# Patient Record
Sex: Female | Born: 1955 | Race: White | Hispanic: No | Smoking: Never smoker
Health system: Southern US, Community
[De-identification: ages and names within clinical notes are randomized; demographics above are authoritative.]

## PROBLEM LIST (undated history)

## (undated) DIAGNOSIS — K219 Gastro-esophageal reflux disease without esophagitis: Secondary | ICD-10-CM

## (undated) DIAGNOSIS — K5909 Other constipation: Secondary | ICD-10-CM

## (undated) DIAGNOSIS — K589 Irritable bowel syndrome without diarrhea: Secondary | ICD-10-CM

## (undated) DIAGNOSIS — R519 Headache, unspecified: Secondary | ICD-10-CM

## (undated) DIAGNOSIS — I10 Essential (primary) hypertension: Secondary | ICD-10-CM

## (undated) DIAGNOSIS — Z8719 Personal history of other diseases of the digestive system: Secondary | ICD-10-CM

## (undated) DIAGNOSIS — Z8489 Family history of other specified conditions: Secondary | ICD-10-CM

## (undated) DIAGNOSIS — R51 Headache: Secondary | ICD-10-CM

## (undated) DIAGNOSIS — R21 Rash and other nonspecific skin eruption: Secondary | ICD-10-CM

## (undated) DIAGNOSIS — D013 Carcinoma in situ of anus and anal canal: Secondary | ICD-10-CM

## (undated) DIAGNOSIS — F329 Major depressive disorder, single episode, unspecified: Secondary | ICD-10-CM

## (undated) DIAGNOSIS — F419 Anxiety disorder, unspecified: Secondary | ICD-10-CM

## (undated) DIAGNOSIS — R350 Frequency of micturition: Secondary | ICD-10-CM

## (undated) DIAGNOSIS — F32A Depression, unspecified: Secondary | ICD-10-CM

## (undated) HISTORY — PX: BREAST BIOPSY: SHX20

## (undated) HISTORY — DX: Essential (primary) hypertension: I10

## (undated) HISTORY — PX: OTHER SURGICAL HISTORY: SHX169

## (undated) HISTORY — DX: Irritable bowel syndrome, unspecified: K58.9

## (undated) HISTORY — PX: TUBAL LIGATION: SHX77

---
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
## 1999-01-10 ENCOUNTER — Other Ambulatory Visit: Admission: RE | Admit: 1999-01-10 | Discharge: 1999-01-10 | Payer: Self-pay | Admitting: *Deleted

## 1999-03-24 ENCOUNTER — Encounter: Payer: Self-pay | Admitting: Emergency Medicine

## 1999-03-24 ENCOUNTER — Emergency Department (HOSPITAL_COMMUNITY): Admission: EM | Admit: 1999-03-24 | Discharge: 1999-03-24 | Payer: Self-pay | Admitting: Emergency Medicine

## 1999-03-28 ENCOUNTER — Encounter: Admission: RE | Admit: 1999-03-28 | Discharge: 1999-03-28 | Payer: Self-pay | Admitting: Obstetrics and Gynecology

## 1999-03-28 ENCOUNTER — Encounter: Payer: Self-pay | Admitting: Obstetrics and Gynecology

## 2000-01-30 ENCOUNTER — Other Ambulatory Visit: Admission: RE | Admit: 2000-01-30 | Discharge: 2000-01-30 | Payer: Self-pay | Admitting: Obstetrics and Gynecology

## 2000-03-07 ENCOUNTER — Ambulatory Visit (HOSPITAL_BASED_OUTPATIENT_CLINIC_OR_DEPARTMENT_OTHER): Admission: RE | Admit: 2000-03-07 | Discharge: 2000-03-07 | Payer: Self-pay | Admitting: Podiatry

## 2000-03-07 HISTORY — PX: OTHER SURGICAL HISTORY: SHX169

## 2000-03-28 ENCOUNTER — Encounter: Admission: RE | Admit: 2000-03-28 | Discharge: 2000-03-28 | Payer: Self-pay | Admitting: Obstetrics and Gynecology

## 2000-03-28 ENCOUNTER — Encounter: Payer: Self-pay | Admitting: Obstetrics and Gynecology

## 2000-12-04 ENCOUNTER — Encounter: Payer: Self-pay | Admitting: Urology

## 2000-12-04 ENCOUNTER — Encounter: Admission: RE | Admit: 2000-12-04 | Discharge: 2000-12-04 | Payer: Self-pay | Admitting: Urology

## 2001-01-15 ENCOUNTER — Other Ambulatory Visit: Admission: RE | Admit: 2001-01-15 | Discharge: 2001-01-15 | Payer: Self-pay | Admitting: *Deleted

## 2001-03-28 ENCOUNTER — Encounter: Payer: Self-pay | Admitting: *Deleted

## 2001-03-28 ENCOUNTER — Encounter: Admission: RE | Admit: 2001-03-28 | Discharge: 2001-03-28 | Payer: Self-pay | Admitting: *Deleted

## 2001-04-04 ENCOUNTER — Encounter: Payer: Self-pay | Admitting: *Deleted

## 2001-04-04 ENCOUNTER — Encounter: Admission: RE | Admit: 2001-04-04 | Discharge: 2001-04-04 | Payer: Self-pay | Admitting: *Deleted

## 2002-01-20 ENCOUNTER — Other Ambulatory Visit: Admission: RE | Admit: 2002-01-20 | Discharge: 2002-01-20 | Payer: Self-pay | Admitting: *Deleted

## 2002-03-30 ENCOUNTER — Encounter: Admission: RE | Admit: 2002-03-30 | Discharge: 2002-03-30 | Payer: Self-pay | Admitting: *Deleted

## 2002-03-30 ENCOUNTER — Encounter: Payer: Self-pay | Admitting: *Deleted

## 2002-10-10 ENCOUNTER — Emergency Department (HOSPITAL_COMMUNITY): Admission: EM | Admit: 2002-10-10 | Discharge: 2002-10-11 | Payer: Self-pay

## 2003-01-26 ENCOUNTER — Other Ambulatory Visit: Admission: RE | Admit: 2003-01-26 | Discharge: 2003-01-26 | Payer: Self-pay | Admitting: *Deleted

## 2003-04-01 ENCOUNTER — Encounter: Admission: RE | Admit: 2003-04-01 | Discharge: 2003-04-01 | Payer: Self-pay | Admitting: *Deleted

## 2004-04-05 ENCOUNTER — Encounter: Admission: RE | Admit: 2004-04-05 | Discharge: 2004-04-05 | Payer: Self-pay | Admitting: *Deleted

## 2004-12-07 ENCOUNTER — Emergency Department (HOSPITAL_COMMUNITY): Admission: EM | Admit: 2004-12-07 | Discharge: 2004-12-07 | Payer: Self-pay | Admitting: Family Medicine

## 2005-04-09 ENCOUNTER — Encounter: Admission: RE | Admit: 2005-04-09 | Discharge: 2005-04-09 | Payer: Self-pay | Admitting: *Deleted

## 2005-04-26 ENCOUNTER — Encounter: Admission: RE | Admit: 2005-04-26 | Discharge: 2005-04-26 | Payer: Self-pay | Admitting: *Deleted

## 2006-01-30 ENCOUNTER — Ambulatory Visit (HOSPITAL_BASED_OUTPATIENT_CLINIC_OR_DEPARTMENT_OTHER): Admission: RE | Admit: 2006-01-30 | Discharge: 2006-01-30 | Payer: Self-pay | Admitting: Urology

## 2006-01-30 ENCOUNTER — Encounter (INDEPENDENT_AMBULATORY_CARE_PROVIDER_SITE_OTHER): Payer: Self-pay | Admitting: Specialist

## 2006-01-30 HISTORY — PX: OTHER SURGICAL HISTORY: SHX169

## 2006-04-29 ENCOUNTER — Encounter: Admission: RE | Admit: 2006-04-29 | Discharge: 2006-04-29 | Payer: Self-pay | Admitting: *Deleted

## 2007-05-05 ENCOUNTER — Encounter: Admission: RE | Admit: 2007-05-05 | Discharge: 2007-05-05 | Payer: Self-pay | Admitting: Obstetrics & Gynecology

## 2007-05-12 ENCOUNTER — Encounter: Admission: RE | Admit: 2007-05-12 | Discharge: 2007-05-12 | Payer: Self-pay | Admitting: Obstetrics & Gynecology

## 2008-04-25 ENCOUNTER — Emergency Department (HOSPITAL_COMMUNITY): Admission: EM | Admit: 2008-04-25 | Discharge: 2008-04-25 | Payer: Self-pay | Admitting: Emergency Medicine

## 2008-05-05 ENCOUNTER — Encounter: Admission: RE | Admit: 2008-05-05 | Discharge: 2008-05-05 | Payer: Self-pay | Admitting: Obstetrics & Gynecology

## 2009-04-02 HISTORY — PX: CATARACT EXTRACTION W/ INTRAOCULAR LENS  IMPLANT, BILATERAL: SHX1307

## 2009-05-10 ENCOUNTER — Encounter: Admission: RE | Admit: 2009-05-10 | Discharge: 2009-05-10 | Payer: Self-pay | Admitting: Obstetrics and Gynecology

## 2009-12-15 ENCOUNTER — Ambulatory Visit (HOSPITAL_COMMUNITY): Admission: RE | Admit: 2009-12-15 | Discharge: 2009-12-15 | Payer: Self-pay | Admitting: Gastroenterology

## 2009-12-15 HISTORY — PX: COLONOSCOPY: SHX174

## 2010-04-22 ENCOUNTER — Encounter: Payer: Self-pay | Admitting: Orthopedic Surgery

## 2010-04-22 ENCOUNTER — Other Ambulatory Visit: Payer: Self-pay | Admitting: Obstetrics and Gynecology

## 2010-04-22 DIAGNOSIS — Z1239 Encounter for other screening for malignant neoplasm of breast: Secondary | ICD-10-CM

## 2010-04-22 DIAGNOSIS — Z1231 Encounter for screening mammogram for malignant neoplasm of breast: Secondary | ICD-10-CM

## 2010-04-23 ENCOUNTER — Encounter: Payer: Self-pay | Admitting: Obstetrics & Gynecology

## 2010-05-11 ENCOUNTER — Ambulatory Visit
Admission: RE | Admit: 2010-05-11 | Discharge: 2010-05-11 | Disposition: A | Discharge status: Home / Self Care | Payer: 59 | Source: Ambulatory Visit | Attending: Obstetrics and Gynecology | Admitting: Obstetrics and Gynecology

## 2010-05-11 DIAGNOSIS — Z1231 Encounter for screening mammogram for malignant neoplasm of breast: Secondary | ICD-10-CM

## 2010-06-20 ENCOUNTER — Other Ambulatory Visit: Payer: Self-pay | Admitting: Obstetrics and Gynecology

## 2010-06-23 ENCOUNTER — Other Ambulatory Visit: Payer: Self-pay | Admitting: Obstetrics and Gynecology

## 2010-08-18 NOTE — Op Note (Signed)
NAMEAUBRE, [name]              ACCOUNT NO.:  [id number]   MEDICAL RECORD NO.:  [id number]          PATIENT TYPE:  AMB   LOCATION:  [facility]                         FACILITY:  [facility]   PHYSICIAN:  Ronald L. Earlene Plater, M.D.  DATE OF BIRTH:  Sep 05, 1955   DATE OF PROCEDURE:  01/30/2006  DATE OF DISCHARGE:                                 OPERATIVE REPORT   PREOPERATIVE DIAGNOSIS:  Possible interstitial cystitis.   OPERATIVE PROCEDURE:  1. Cystoscopy.  2. Hydraulic bladder distention.  3. Bladder biopsy.   SURGEON:  Lucrezia Starch. Earlene Plater, M.D.   ANESTHESIA:  LMA.   ESTIMATED BLOOD LOSS:  Negligible.   TUBES:  None.   COMPLICATIONS:  None.   INDICATIONS FOR PROCEDURE:  Ms. April Rodriguez is a lovely 55 year old white female  who basically presented with progressive bladder symptoms.  She had a  presumptive diagnosis of interstitial cystitis in the past, and has had DMSO  treatments.  More recently, it has been more progressive with urgency and  suprapubic pressure and actually having some urge incontinence.  She has  bladder pressure and pain, relieved by voiding.  Sometimes slight pain after  voiding.  She also has migraine headaches.  She notes some dysuria,  frequency and hesitance.   DESCRIPTION OF PROCEDURE:  The patient was placed in the supine position,  and after the proper LMA anesthesia, was placed in the dorsal lithotomy  position and prepped and draped with Betadine in a sterile fashion.  A  cystourethroscopy was performed with a 22.5-French Olympus panendoscope  utilizing the 12-degree and the 70-degree lenses.  The bladder was carefully  inspected and noted to be without lesion.  Efflux of clear urine was noted  from the normally-placed ureteral orifices bilaterally, and the bladder was  smooth-walled.  A hydraulic bladder distention was then performed, and the  bladder was distended to 80 cm of water pressure with normal saline.  The  capacity was noted to be 800 mL.  When it was  relieved, there were a few  lateral glomerulations, although they were very sparse.  There were  certainly no cracks and no bleeding.  A biopsy was obtained from the  posterior midline and  submitted to pathology.  The patient base was cauterized with Bovie  coagulation current.  The bladder was drained.  The panendoscope was  removed.   The patient was taken to the recovery room, stable.      Ronald L. Earlene Plater, M.D.  Electronically Signed     RLD/MEDQ  D:  01/30/2006  T:  01/30/2006  Job:  [id number]

## 2010-08-18 NOTE — Op Note (Signed)
[place]  Patient:    April Rodriguez, April Rodriguez                     MRN: [id number] Proc. Date: 03/07/00 Adm. Date:  [id number] Attending:  Cordella Register                           Operative Report  BRIEF HISTORY:  The patient presents with large bunion deformity of the right foot of long-term nature.  She has had this extensively and has tried conservative care without relief of symptoms.  SURGEON:  Cordella Register, D.P.M.  PREOPERATIVE DIAGNOSIS:  Hallux abductovalgus deformity - right foot.  POSTOPERATIVE DIAGNOSIS:  Hallux abductovalgus deformity - right foot.  ANESTHESIA:  IV sedation with local infiltration by the department of Anesthesia.  HEMOSTASIS:  Ankle tourniquet, right.  PROCEDURE PERFORMED:  Austin bunionectomy with 0.045 internal K-wire fixation - right.  INDICATIONS FOR SURGERY:  Chronic discomfort of the joint, inability to wear shoe gear, failure to respond to wider shoes, soak therapy and orthotic therapy.  DESCRIPTION OF PROCEDURE:  The patient was brought to the OR and placed in supine position on the OR table.  The patient injected with total of 9 cc Xylocaine/ Marcaine mixture.  The patients right foot was prepped and draped utilizing standard aseptic technique.  The right foot was exsanguinated utilizing Esmarch.  The right ankle tourniquet was inflated to 250 mmHg and the following procedure was performed - Eliberto Ivory bunionectomy right.  Attention was directed to the dorsal aspect right foot overlying the first metatarsal phalangeal joint where an approximate 6 cm curvilinear incision was made.  The incision was deepened through subcutaneous tissue to the level of the capsular tissue where an inverted L-shaped capsular incision was performed.  The capsular tissue was sharply dissected off the underlying bone revealing a large hyperostosis on the medial aspect of the first metatarsal head.  The first intermetatarsal  space was then entered and the conjoined tendon to the adductor tendon was released.  Attention was then directed back to the medial aspect of the first metatarsal head.  The redundant bone was resected off the shaft of the first metatarsal.  A V-shaped osteotomy was then performed to the first metatarsal head at the apex mid metaphysis and based at the level of the anatomical neck of the first metatarsal.  The capitular bed was transposed in a lateral direction so as to reduce the increase ______ metatarsal angle and was fixated utilizing 0.045 internal K-wire fixation. The redundant medial shelf was resected flush with the shaft of the first metatarsal.  All roughened bone edges were rasped smooth.  The wound was flushed with copious amounts of sterile Garamycin solution.  The capsular tissue was reapproximated utilizing with 3-0 Dexon in a continuous running fashion and subcutaneous tissues were reapproximated and maintained utilizing 4-0 Dexon in continuous running fashion.  Skin margins were reapproximated and maintained utilizing 5-0 Dexon in a subcuticular fashion.  Surgical site was infiltrated 1 cc of Dexamethasone and a dry, sterile compressive dressing was applied to the right foot.  The right ankle tourniquet was deflated. Capillary refill was noted immediately to all digits of the right foot.  The patient tolerated the surgery and anesthesia well, was transferred from the OR in satisfactory condition to the department of anesthesia and was discharged. DD:  03/07/00 TD:  03/07/00 Job: [id number]

## 2011-04-10 ENCOUNTER — Other Ambulatory Visit: Payer: Self-pay | Admitting: Obstetrics and Gynecology

## 2011-04-10 DIAGNOSIS — Z1231 Encounter for screening mammogram for malignant neoplasm of breast: Secondary | ICD-10-CM

## 2011-05-16 ENCOUNTER — Ambulatory Visit
Admission: RE | Admit: 2011-05-16 | Discharge: 2011-05-16 | Disposition: A | Discharge status: Home / Self Care | Payer: 59 | Source: Ambulatory Visit | Attending: Obstetrics and Gynecology | Admitting: Obstetrics and Gynecology

## 2011-05-16 DIAGNOSIS — Z1231 Encounter for screening mammogram for malignant neoplasm of breast: Secondary | ICD-10-CM

## 2011-05-22 ENCOUNTER — Encounter: Payer: Self-pay | Admitting: Pulmonary Disease

## 2011-05-23 ENCOUNTER — Institutional Professional Consult (permissible substitution): Payer: 59 | Admitting: Pulmonary Disease

## 2011-06-28 ENCOUNTER — Ambulatory Visit (INDEPENDENT_AMBULATORY_CARE_PROVIDER_SITE_OTHER): Payer: 59 | Admitting: Pulmonary Disease

## 2011-06-28 ENCOUNTER — Encounter: Payer: Self-pay | Admitting: Pulmonary Disease

## 2011-06-28 VITALS — BP 124/70 | HR 83 | Temp 97.8°F | Ht 63.0 in | Wt 130.4 lb

## 2011-06-28 DIAGNOSIS — G4700 Insomnia, unspecified: Secondary | ICD-10-CM | POA: Insufficient documentation

## 2011-06-28 NOTE — Assessment & Plan Note (Signed)
The patient has been having ongoing issues with initiation and maintenance of sleep.  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  I did not appreciate any history that would suggest obstructive sleep apnea, or REM behavior disorder.  She does have some mild parasomnias with sleep talking and rarely sleep walking.  Her history does suggest the possibility of the restless leg syndrome.  I have asked her to go home and think about her symptoms and let me know if she is having leg issues.  In the meantime, I've had a long discussion with her about sleep hygiene and also behavioral therapies for insomnia.  I have outlined ritualistic behaviors, as well as stimulus control therapy.

## 2011-06-28 NOTE — Progress Notes (Signed)
  Subjective:    Patient ID: [name], female    DOB: [birth year], 56 y.o.   MRN: [id number]  HPI The patient is a 56 year old female who is referred for evaluation of insomnia.  The patient has had issues with sleep onset and maintenance for many years, and it continues to be an issue for her.  She typically goes to bed between 9:30 and 10:00 at night, and usually takes 30 minutes or more to get to sleep.  Sometimes it may take hours.  The patient states that her "mind races".  When she gets to sleep, she may have 2 or 3 awakenings during the night, and then has trouble getting back to sleep.  The patient typically starts her day at 6 AM, and does not feel rested upon arising.  Patient does not watch TV in bed or read.  Her husband has not heard snoring or an abnormal breathing pattern, but she does occasionally have sleep talking.  The patient states that she has vivid dreams, but she does not have abnormal behaviors during the night which suggest that she is acting out her dreams.  Her husband states that she does kick/twitch a lot during the night, and when questioned carefully she is unsure if she may have restless leg syndrome symptoms.  The patient drinks 2-3 caffeinated beverages a day, with the last being before 11 AM.  She does not nap during the day.  She denies significant sleepiness in the evenings while watching television or reading.   Review of Systems  Constitutional: Negative for fever and unexpected weight change.  HENT: Positive for ear pain, congestion, sore throat and sneezing. Negative for nosebleeds, rhinorrhea, trouble swallowing, dental problem, postnasal drip and sinus pressure.   Eyes: Negative for redness and itching.  Respiratory: Positive for cough. Negative for chest tightness, shortness of breath and wheezing.   Cardiovascular: Negative for palpitations and leg swelling.  Gastrointestinal: Negative for nausea and vomiting.  Genitourinary: Negative for dysuria.    Musculoskeletal: Negative for joint swelling.  Skin: Negative for rash.  Neurological: Positive for headaches.  Hematological: Does not bruise/bleed easily.  Psychiatric/Behavioral: Negative for dysphoric mood. The patient is not nervous/anxious.        Objective:   Physical Exam Constitutional:  Well developed, no acute distress  HENT:  Nares patent without discharge  Oropharynx without exudate, palate and uvula are normal  Eyes:  Perrla, eomi, no scleral icterus  Neck:  No JVD, no TMG  Cardiovascular:  Normal rate, regular rhythm, no rubs or gallops.  No murmurs        Intact distal pulses  Pulmonary :  Normal breath sounds, no stridor or respiratory distress   No rales, rhonchi, or wheezing  Abdominal:  Soft, nondistended, bowel sounds present.  No tenderness noted.   Musculoskeletal:  No lower extremity edema noted.  Lymph Nodes:  No cervical lymphadenopathy noted  Skin:  No cyanosis noted  Neurologic:  Alert, appropriate, moves all 4 extremities without obvious deficit.         Assessment & Plan:

## 2011-06-28 NOTE — Patient Instructions (Signed)
Pay attention to your legs in evening while sitting down, and when you first go to bed.  Let me know if they feel uncomfortable or if you have an overwhelming sensation to move them. Do something everynight for about before bedtime that relaxes you. Do not watch tv or read in bed. If you cannot get to sleep within , leave bedroom and go to family room to read or watch tv.  Do not eat/drink/or use computer.  We you get sleepy again, try to go back to sleep in bedroom.  Do this as many times as you have to until you finally fall asleep Try melatonin 3mg  about 3-4 hrs BEFORE bedtime.   No napping during day, no caffeine after 10am. followup with me in 3 weeks.

## 2011-07-12 ENCOUNTER — Other Ambulatory Visit: Payer: Self-pay | Admitting: Obstetrics and Gynecology

## 2011-07-12 DIAGNOSIS — M949 Disorder of cartilage, unspecified: Secondary | ICD-10-CM

## 2011-07-12 DIAGNOSIS — M899 Disorder of bone, unspecified: Secondary | ICD-10-CM

## 2012-03-11 ENCOUNTER — Ambulatory Visit
Admission: RE | Admit: 2012-03-11 | Discharge: 2012-03-11 | Disposition: A | Discharge status: Home / Self Care | Payer: 59 | Source: Ambulatory Visit | Attending: Obstetrics and Gynecology | Admitting: Obstetrics and Gynecology

## 2012-03-11 DIAGNOSIS — M899 Disorder of bone, unspecified: Secondary | ICD-10-CM

## 2012-04-30 ENCOUNTER — Other Ambulatory Visit: Payer: Self-pay | Admitting: Obstetrics and Gynecology

## 2012-04-30 DIAGNOSIS — Z1231 Encounter for screening mammogram for malignant neoplasm of breast: Secondary | ICD-10-CM

## 2012-05-27 ENCOUNTER — Ambulatory Visit
Admission: RE | Admit: 2012-05-27 | Discharge: 2012-05-27 | Disposition: A | Discharge status: Home / Self Care | Payer: 59 | Source: Ambulatory Visit | Attending: Obstetrics and Gynecology | Admitting: Obstetrics and Gynecology

## 2012-05-27 DIAGNOSIS — Z1231 Encounter for screening mammogram for malignant neoplasm of breast: Secondary | ICD-10-CM

## 2012-10-24 ENCOUNTER — Other Ambulatory Visit: Payer: Self-pay | Admitting: Obstetrics and Gynecology

## 2012-10-24 DIAGNOSIS — N644 Mastodynia: Secondary | ICD-10-CM

## 2012-10-30 ENCOUNTER — Ambulatory Visit
Admission: RE | Admit: 2012-10-30 | Discharge: 2012-10-30 | Disposition: A | Discharge status: Home / Self Care | Payer: 59 | Source: Ambulatory Visit | Attending: Obstetrics and Gynecology | Admitting: Obstetrics and Gynecology

## 2012-10-30 DIAGNOSIS — N644 Mastodynia: Secondary | ICD-10-CM

## 2013-04-21 ENCOUNTER — Other Ambulatory Visit: Payer: Self-pay

## 2013-04-21 DIAGNOSIS — Z1231 Encounter for screening mammogram for malignant neoplasm of breast: Secondary | ICD-10-CM

## 2013-05-29 ENCOUNTER — Ambulatory Visit: Payer: 59

## 2013-06-12 ENCOUNTER — Ambulatory Visit
Admission: RE | Admit: 2013-06-12 | Discharge: 2013-06-12 | Disposition: A | Discharge status: Home / Self Care | Payer: 59 | Source: Ambulatory Visit

## 2013-06-12 DIAGNOSIS — Z1231 Encounter for screening mammogram for malignant neoplasm of breast: Secondary | ICD-10-CM

## 2014-01-20 ENCOUNTER — Ambulatory Visit (HOSPITAL_COMMUNITY)
Admission: RE | Admit: 2014-01-20 | Discharge: 2014-01-20 | Disposition: A | Discharge status: Home / Self Care | Payer: 59 | Source: Ambulatory Visit | Attending: Internal Medicine | Admitting: Internal Medicine

## 2014-01-20 ENCOUNTER — Other Ambulatory Visit: Payer: 59

## 2014-01-20 ENCOUNTER — Other Ambulatory Visit: Payer: Self-pay | Admitting: Internal Medicine

## 2014-01-20 DIAGNOSIS — R1011 Right upper quadrant pain: Secondary | ICD-10-CM | POA: Insufficient documentation

## 2014-01-20 DIAGNOSIS — R110 Nausea: Secondary | ICD-10-CM | POA: Diagnosis not present

## 2014-05-07 ENCOUNTER — Other Ambulatory Visit: Payer: Self-pay

## 2014-05-07 DIAGNOSIS — Z1231 Encounter for screening mammogram for malignant neoplasm of breast: Secondary | ICD-10-CM

## 2014-06-15 ENCOUNTER — Ambulatory Visit
Admission: RE | Admit: 2014-06-15 | Discharge: 2014-06-15 | Disposition: A | Discharge status: Home / Self Care | Payer: 59 | Source: Ambulatory Visit

## 2014-06-15 DIAGNOSIS — Z1231 Encounter for screening mammogram for malignant neoplasm of breast: Secondary | ICD-10-CM

## 2014-11-30 ENCOUNTER — Other Ambulatory Visit: Payer: Self-pay | Admitting: General Surgery

## 2014-11-30 NOTE — H&P (Signed)
April Rodriguez 11/30/2014 10:02 AM Location: [facility] Patient #: [id number] DOB: 1955/06/17 Married / Language: Cleophus Molt / Race: White Female History of Present Illness [name] MD; 5/42/7062 10:13 AM) Patient words: perirectal carcinoma.  The patient is a 59 year old female who presents with anal lesions. 59 year old female who presents to the office as a referral from OB/GYN. She was noted to have a perianal lesion on her annual exam. This was biopsied and showed carcinoma in situ. She denies any symptoms from the lesion and does not know how long it had been there. She denies any bleeding. She has had some increasing constipation over the past few years. She is up-to-date on her colonoscopies. She thinks her last colonoscopy was about 6 years ago and normal. Other Problems Mammie Lorenzo, LPN; 3/76/2831 51:76 AM) Anxiety Disorder Back Pain Bladder Problems Diverticulosis Gastroesophageal Reflux Disease Hemorrhoids High blood pressure Hypercholesterolemia Migraine Headache  Past Surgical History Mammie Lorenzo, LPN; 1/60/7371 06:26 AM) Breast Biopsy Left. Cataract Surgery Bilateral. Foot Surgery Right. Hemorrhoidectomy  Diagnostic Studies History Mammie Lorenzo, LPN; 9/48/5462 70:35 AM) Colonoscopy 5-10 years ago Mammogram within last year Pap Smear 1-5 years ago  Allergies Mammie Lorenzo, LPN; 0/12/3816 29:93 AM) Erythromycin *MACROLIDES* Abdominal pain, Diarrhea.  Medication History Mammie Lorenzo, LPN; 10/15/9676 93:81 AM) Fluticasone Furoate (100MCG/ACT Aero Pow Br Act, Inhalation as needed) Active. Meclizine HCl (12.5MG  Tablet, Oral as needed) Active. HyoMax-DT (0.375MG  Tablet ER, Oral) Active. Levocetirizine Dihydrochloride (5MG  Tablet, Oral) Active. KlonoPIN (0.5MG  Tablet, Oral) Active. Venlafaxine HCl (75MG  Tablet, Oral) Active. Pravachol (40MG  Tablet, Oral) Active. Medications Reconciled  Social History Mammie Lorenzo, LPN; 0/17/5102 58:52 AM) Caffeine use Coffee, Tea. No alcohol use No drug use Tobacco use Never smoker.  Family History Mammie Lorenzo, LPN; 7/78/2423 53:61 AM) Alcohol Abuse Brother. Arthritis Mother. Breast Cancer Mother. Cancer Father. Cerebrovascular Accident Brother, Father, Mother. Colon Polyps Brother. Depression Brother. Diabetes Mellitus Mother. Heart Disease Mother, Sister. Heart disease in female family member before age 46 Hypertension Mother, Sister. Melanoma Family Members In General. Migraine Headache Daughter, Sister. Prostate Cancer Father. Respiratory Condition Sister.  Pregnancy / Birth History Mammie Lorenzo, LPN; 4/43/1540 08:67 AM) Age at menarche 60 years. Age of menopause 33-50 Gravida 2 Maternal age 60-20 Para 2     Review of Systems Mammie Lorenzo LPN; 09/19/5091 26:71 AM) General Present- Fatigue. Not Present- Appetite Loss, Chills, Fever, Night Sweats, Weight Gain and Weight Loss. Skin Present- New Lesions. Not Present- Change in Wart/Mole, Dryness, Hives, Jaundice, Non-Healing Wounds, Rash and Ulcer. HEENT Not Present- Earache, Hearing Loss, Hoarseness, Nose Bleed, Oral Ulcers, Ringing in the Ears, Seasonal Allergies, Sinus Pain, Sore Throat, Visual Disturbances, Wears glasses/contact lenses and Yellow Eyes. Respiratory Not Present- Bloody sputum, Chronic Cough, Difficulty Breathing, Snoring and Wheezing. Breast Not Present- Breast Mass, Breast Pain, Nipple Discharge and Skin Changes. Cardiovascular Not Present- Chest Pain, Difficulty Breathing Lying Down, Leg Cramps, Palpitations, Rapid Heart Rate, Shortness of Breath and Swelling of Extremities. Gastrointestinal Present- Constipation and Hemorrhoids. Not Present- Abdominal Pain, Bloating, Bloody Stool, Change in Bowel Habits, Chronic diarrhea, Difficulty Swallowing, Excessive gas, Gets full quickly at meals, Indigestion, Nausea, Rectal Pain and Vomiting. Female  Genitourinary Not Present- Frequency, Nocturia, Painful Urination, Pelvic Pain and Urgency. Musculoskeletal Not Present- Back Pain, Joint Pain, Joint Stiffness, Muscle Pain, Muscle Weakness and Swelling of Extremities. Neurological Not Present- Decreased Memory, Fainting, Headaches, Numbness, Seizures, Tingling, Tremor, Trouble walking and Weakness. Psychiatric Not Present- Anxiety, Bipolar, Change in Sleep Pattern, Depression, Fearful and Frequent crying. Endocrine  Not Present- Cold Intolerance, Excessive Hunger, Hair Changes, Heat Intolerance, Hot flashes and New Diabetes. Hematology Not Present- Easy Bruising, Excessive bleeding, Gland problems, HIV and Persistent Infections.  Vitals Claiborne Billings Dockery LPN; 5/00/9381 82:99 AM) 11/30/2014 10:02 AM Weight: 131.4 lb Height: 63in Body Surface Area: 1.63 m Body Mass Index: 23.28 kg/m Temp.: 98.79F(Oral)  Pulse: 89 (Regular)  BP: 118/74 (Sitting, Left Arm, Standard)     Physical Exam [name] MD; 3/71/6967 12:57 PM)  General Mental Status-Alert. General Appearance-Consistent with stated age. Hydration-Well hydrated. Voice-Normal.  Head and Neck Head-normocephalic, atraumatic with no lesions or palpable masses. Trachea-midline. Thyroid Gland Characteristics - normal size and consistency.  Eye Eyeball - Bilateral-Extraocular movements intact. Sclera/Conjunctiva - Bilateral-No scleral icterus.  Chest and Lung Exam Chest and lung exam reveals -quiet, even and easy respiratory effort with no use of accessory muscles and on auscultation, normal breath sounds, no adventitious sounds and normal vocal resonance. Inspection Chest Wall - Normal. Back - normal.  Cardiovascular Cardiovascular examination reveals -normal heart sounds, regular rate and rhythm with no murmurs and normal pedal pulses bilaterally.  Abdomen Inspection Inspection of the abdomen reveals - No Hernias. Skin - Scar - no surgical  scars. Palpation/Percussion Palpation and Percussion of the abdomen reveal - Soft, Non Tender, No Rebound tenderness, No Rigidity (guarding) and No hepatosplenomegaly. Auscultation Auscultation of the abdomen reveals - Bowel sounds normal.  Rectal Anorectal Exam External - Note: Left anterior lesion approximately 3 cm in length and 1 cm in width with no fixation underlying tissues. No sphincter involvement.  Neurologic Neurologic evaluation reveals -alert and oriented x 3 with no impairment of recent or remote memory. Mental Status-Normal.  Musculoskeletal Normal Exam - Left-Upper Extremity Strength Normal and Lower Extremity Strength Normal. Normal Exam - Right-Upper Extremity Strength Normal and Lower Extremity Strength Normal.    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 Biopsies show carcinoma in situ. On exam I do not see any signs of invasive carcinoma or sphincter involvement. I would like to do an excision of the anal margin. We will then reassess pathology and determine if any further treatment is needed. We discussed the typical causes of these lesions as well as the typical course. All questions were answered. Risk of the procedure include bleeding, pain and infection.

## 2014-12-01 ENCOUNTER — Other Ambulatory Visit: Payer: Self-pay | Admitting: Obstetrics and Gynecology

## 2014-12-01 DIAGNOSIS — M8580 Other specified disorders of bone density and structure, unspecified site: Secondary | ICD-10-CM

## 2014-12-03 ENCOUNTER — Encounter (HOSPITAL_BASED_OUTPATIENT_CLINIC_OR_DEPARTMENT_OTHER): Payer: Self-pay | Admitting: *Deleted

## 2014-12-03 NOTE — Progress Notes (Signed)
NPO AFTER MN.  ARRIVE AT 1030.  NEEDS ISTAT AND EKG.  WILL TAKE AM MEDS W/ SIPS OF WATER.

## 2014-12-10 ENCOUNTER — Encounter (HOSPITAL_BASED_OUTPATIENT_CLINIC_OR_DEPARTMENT_OTHER): Payer: Self-pay | Admitting: *Deleted

## 2014-12-10 ENCOUNTER — Ambulatory Visit (HOSPITAL_BASED_OUTPATIENT_CLINIC_OR_DEPARTMENT_OTHER): Payer: 59 | Admitting: Certified Registered"

## 2014-12-10 ENCOUNTER — Ambulatory Visit (HOSPITAL_BASED_OUTPATIENT_CLINIC_OR_DEPARTMENT_OTHER)
Admission: RE | Admit: 2014-12-10 | Discharge: 2014-12-10 | Disposition: A | Discharge status: Home / Self Care | Payer: 59 | Source: Ambulatory Visit | Attending: General Surgery | Admitting: General Surgery

## 2014-12-10 ENCOUNTER — Encounter (HOSPITAL_BASED_OUTPATIENT_CLINIC_OR_DEPARTMENT_OTHER)
Admission: RE | Disposition: A | Discharge status: Home / Self Care | Payer: Self-pay | Source: Ambulatory Visit | Attending: General Surgery

## 2014-12-10 DIAGNOSIS — D045 Carcinoma in situ of skin of trunk: Secondary | ICD-10-CM | POA: Diagnosis present

## 2014-12-10 DIAGNOSIS — K219 Gastro-esophageal reflux disease without esophagitis: Secondary | ICD-10-CM | POA: Insufficient documentation

## 2014-12-10 DIAGNOSIS — Z79899 Other long term (current) drug therapy: Secondary | ICD-10-CM | POA: Diagnosis not present

## 2014-12-10 DIAGNOSIS — F419 Anxiety disorder, unspecified: Secondary | ICD-10-CM | POA: Diagnosis not present

## 2014-12-10 DIAGNOSIS — E780 Pure hypercholesterolemia: Secondary | ICD-10-CM | POA: Insufficient documentation

## 2014-12-10 DIAGNOSIS — I10 Essential (primary) hypertension: Secondary | ICD-10-CM | POA: Diagnosis not present

## 2014-12-10 DIAGNOSIS — K449 Diaphragmatic hernia without obstruction or gangrene: Secondary | ICD-10-CM | POA: Insufficient documentation

## 2014-12-10 DIAGNOSIS — Z7951 Long term (current) use of inhaled steroids: Secondary | ICD-10-CM | POA: Diagnosis not present

## 2014-12-10 DIAGNOSIS — D013 Carcinoma in situ of anus and anal canal: Secondary | ICD-10-CM | POA: Diagnosis not present

## 2014-12-10 HISTORY — DX: Rash and other nonspecific skin eruption: R21

## 2014-12-10 HISTORY — PX: RECTAL EXAM UNDER ANESTHESIA: SHX6399

## 2014-12-10 HISTORY — DX: Depression, unspecified: F32.A

## 2014-12-10 HISTORY — DX: Anxiety disorder, unspecified: F41.9

## 2014-12-10 HISTORY — DX: Personal history of other diseases of the digestive system: Z87.19

## 2014-12-10 HISTORY — DX: Carcinoma in situ of anus and anal canal: D01.3

## 2014-12-10 HISTORY — DX: Other constipation: K59.09

## 2014-12-10 HISTORY — DX: Major depressive disorder, single episode, unspecified: F32.9

## 2014-12-10 HISTORY — DX: Gastro-esophageal reflux disease without esophagitis: K21.9

## 2014-12-10 HISTORY — DX: Frequency of micturition: R35.0

## 2014-12-10 LAB — POCT I-STAT 4, (NA,K, GLUC, HGB,HCT)
Glucose, Bld: 92 mg/dL (ref 65–99)
HEMATOCRIT: 39 % (ref 36.0–46.0)
HEMOGLOBIN: 13.3 g/dL (ref 12.0–15.0)
POTASSIUM: 3.9 mmol/L (ref 3.5–5.1)
SODIUM: 143 mmol/L (ref 135–145)

## 2014-12-10 SURGERY — EXAM UNDER ANESTHESIA, RECTUM
Anesthesia: Monitor Anesthesia Care

## 2014-12-10 MED ORDER — HYDROCODONE-ACETAMINOPHEN 5-325 MG PO TABS: 1.0000 | ORAL_TABLET | ORAL | Status: DC | PRN

## 2014-12-10 MED ORDER — FENTANYL CITRATE (PF) 100 MCG/2ML IJ SOLN
INTRAMUSCULAR | Status: AC
  Filled 2014-12-10: qty 4

## 2014-12-10 MED ORDER — FENTANYL CITRATE (PF) 100 MCG/2ML IJ SOLN
25.0000 ug | INTRAMUSCULAR | Status: DC | PRN
  Filled 2014-12-10: qty 1

## 2014-12-10 MED ORDER — MEPERIDINE HCL 25 MG/ML IJ SOLN
6.2500 mg | INTRAMUSCULAR | Status: DC | PRN
  Filled 2014-12-10: qty 1

## 2014-12-10 MED ORDER — PROPOFOL 500 MG/50ML IV EMUL
INTRAVENOUS | Status: DC | PRN
  Administered 2014-12-10: 160 ug/kg/min via INTRAVENOUS

## 2014-12-10 MED ORDER — ACETIC ACID 5 % SOLN
Status: DC | PRN
  Administered 2014-12-10: 1 via TOPICAL

## 2014-12-10 MED ORDER — HYDROCODONE-ACETAMINOPHEN 5-325 MG PO TABS
ORAL_TABLET | ORAL | Status: AC
  Filled 2014-12-10: qty 1

## 2014-12-10 MED ORDER — KETAMINE HCL 50 MG/ML IJ SOLN
INTRAMUSCULAR | Status: AC
  Filled 2014-12-10: qty 10

## 2014-12-10 MED ORDER — MIDAZOLAM HCL 2 MG/2ML IJ SOLN
INTRAMUSCULAR | Status: AC
  Filled 2014-12-10: qty 2

## 2014-12-10 MED ORDER — KETAMINE HCL 100 MG/ML IJ SOLN
250.0000 mg | INTRAMUSCULAR | Status: DC | PRN
  Administered 2014-12-10: 14 ug/kg/min via INTRAVENOUS

## 2014-12-10 MED ORDER — LIDOCAINE 5 % EX OINT
TOPICAL_OINTMENT | CUTANEOUS | Status: DC | PRN
  Administered 2014-12-10: 1

## 2014-12-10 MED ORDER — MIDAZOLAM HCL 5 MG/5ML IJ SOLN
INTRAMUSCULAR | Status: DC | PRN
  Administered 2014-12-10: 1 mg via INTRAVENOUS

## 2014-12-10 MED ORDER — HYDROCODONE-ACETAMINOPHEN 5-325 MG PO TABS
1.0000 | ORAL_TABLET | Freq: Once | ORAL | Status: AC
  Administered 2014-12-10: 1 via ORAL
  Filled 2014-12-10: qty 1

## 2014-12-10 MED ORDER — LACTATED RINGERS IV SOLN
INTRAVENOUS | Status: DC
Start: 2014-12-10 — End: 2014-12-10
  Administered 2014-12-10: 11:00:00 via INTRAVENOUS
  Filled 2014-12-10: qty 1000

## 2014-12-10 MED ORDER — FENTANYL CITRATE (PF) 100 MCG/2ML IJ SOLN
50.0000 ug | Freq: Once | INTRAMUSCULAR | Status: AC
  Administered 2014-12-10: 50 ug via INTRAVENOUS
  Filled 2014-12-10: qty 1

## 2014-12-10 MED ORDER — PROMETHAZINE HCL 25 MG/ML IJ SOLN
6.2500 mg | INTRAMUSCULAR | Status: DC | PRN
  Filled 2014-12-10: qty 1

## 2014-12-10 MED ORDER — LACTATED RINGERS IV SOLN
INTRAVENOUS | Status: DC
  Filled 2014-12-10: qty 1000

## 2014-12-10 MED ORDER — FENTANYL CITRATE (PF) 100 MCG/2ML IJ SOLN
INTRAMUSCULAR | Status: DC | PRN
  Administered 2014-12-10: 50 ug via INTRAVENOUS

## 2014-12-10 MED ORDER — FENTANYL CITRATE (PF) 100 MCG/2ML IJ SOLN
INTRAMUSCULAR | Status: AC
  Filled 2014-12-10: qty 2

## 2014-12-10 MED ORDER — BUPIVACAINE LIPOSOME 1.3 % IJ SUSP
INTRAMUSCULAR | Status: DC | PRN
  Administered 2014-12-10: 20 mL

## 2014-12-10 SURGICAL SUPPLY — 56 items
APL SKNCLS STERI-STRIP NONHPOA (GAUZE/BANDAGES/DRESSINGS) ×2
BENZOIN TINCTURE PRP APPL 2/3 (GAUZE/BANDAGES/DRESSINGS) ×3 IMPLANT
BLADE HEX COATED 2.75 (ELECTRODE) ×2 IMPLANT
BLADE SURG 10 STRL SS (BLADE) ×2 IMPLANT
BLADE SURG 15 STRL LF DISP TIS (BLADE) IMPLANT
BLADE SURG 15 STRL SS (BLADE)
BRIEF STRETCH FOR OB PAD LRG (UNDERPADS AND DIAPERS) ×4 IMPLANT
CANISTER SUCTION 2500CC (MISCELLANEOUS) ×2 IMPLANT
CLOTH BEACON ORANGE TIMEOUT ST (SAFETY) ×2 IMPLANT
COVER BACK TABLE 60X90IN (DRAPES) ×2 IMPLANT
COVER MAYO STAND STRL (DRAPES) ×2 IMPLANT
DECANTER SPIKE VIAL GLASS SM (MISCELLANEOUS) ×1 IMPLANT
DRAPE LG THREE QUARTER DISP (DRAPES) ×4 IMPLANT
DRAPE PED LAPAROTOMY (DRAPES) ×2 IMPLANT
DRAPE UNDERBUTTOCKS STRL (DRAPE) IMPLANT
DRAPE UTILITY XL STRL (DRAPES) ×2 IMPLANT
DRSG PAD ABDOMINAL 8X10 ST (GAUZE/BANDAGES/DRESSINGS) ×1 IMPLANT
ELECT BLADE 6.5 .24CM SHAFT (ELECTRODE) IMPLANT
ELECT REM PT RETURN 9FT ADLT (ELECTROSURGICAL) ×2
ELECTRODE REM PT RTRN 9FT ADLT (ELECTROSURGICAL) ×1 IMPLANT
GAUZE SPONGE 4X4 16PLY XRAY LF (GAUZE/BANDAGES/DRESSINGS) IMPLANT
GAUZE VASELINE 3X9 (GAUZE/BANDAGES/DRESSINGS) IMPLANT
GLOVE BIO SURGEON STRL SZ 6.5 (GLOVE) ×4 IMPLANT
GLOVE INDICATOR 7.0 STRL GRN (GLOVE) ×4 IMPLANT
GOWN STRL REUS W/ TWL LRG LVL3 (GOWN DISPOSABLE) ×1 IMPLANT
GOWN STRL REUS W/ TWL XL LVL3 (GOWN DISPOSABLE) ×2 IMPLANT
GOWN STRL REUS W/TWL LRG LVL3 (GOWN DISPOSABLE) ×2
GOWN STRL REUS W/TWL XL LVL3 (GOWN DISPOSABLE) ×4
LEGGING LITHOTOMY PAIR STRL (DRAPES) IMPLANT
LOOP VESSEL MAXI BLUE (MISCELLANEOUS) IMPLANT
NDL HYPO 25X1 1.5 SAFETY (NEEDLE) ×1 IMPLANT
NDL SAFETY ECLIPSE 18X1.5 (NEEDLE) IMPLANT
NEEDLE HYPO 18GX1.5 SHARP (NEEDLE)
NEEDLE HYPO 25X1 1.5 SAFETY (NEEDLE) ×2 IMPLANT
NS IRRIG 500ML POUR BTL (IV SOLUTION) ×2 IMPLANT
PACK BASIN DAY SURGERY FS (CUSTOM PROCEDURE TRAY) ×2 IMPLANT
PAD ABD 8X10 STRL (GAUZE/BANDAGES/DRESSINGS) IMPLANT
PAD ARMBOARD 7.5X6 YLW CONV (MISCELLANEOUS) ×1 IMPLANT
PENCIL BUTTON HOLSTER BLD 10FT (ELECTRODE) ×2 IMPLANT
SPONGE GAUZE 4X4 12PLY (GAUZE/BANDAGES/DRESSINGS) IMPLANT
SPONGE GAUZE 4X4 12PLY STER LF (GAUZE/BANDAGES/DRESSINGS) ×1 IMPLANT
SPONGE SURGIFOAM ABS GEL 12-7 (HEMOSTASIS) IMPLANT
SUT CHROMIC 2 0 SH (SUTURE) IMPLANT
SUT ETHIBOND 0 (SUTURE) IMPLANT
SUT GUT CHROMIC 3 0 (SUTURE) ×1 IMPLANT
SUT MON AB 3-0 SH 27 (SUTURE) ×2
SUT MON AB 3-0 SH27 (SUTURE) ×1 IMPLANT
SUT VIC AB 4-0 P-3 18XBRD (SUTURE) IMPLANT
SUT VIC AB 4-0 P3 18 (SUTURE)
SUT VICRYL 3 0 BR 18  UND (SUTURE) ×1
SUT VICRYL 3 0 BR 18 UND (SUTURE) IMPLANT
SYR CONTROL 10ML LL (SYRINGE) ×2 IMPLANT
TOWEL OR 17X24 6PK STRL BLUE (TOWEL DISPOSABLE) ×2 IMPLANT
TRAY DSU PREP LF (CUSTOM PROCEDURE TRAY) ×2 IMPLANT
TUBE CONNECTING 12X1/4 (SUCTIONS) ×2 IMPLANT
YANKAUER SUCT BULB TIP NO VENT (SUCTIONS) ×2 IMPLANT

## 2014-12-10 NOTE — Anesthesia Preprocedure Evaluation (Signed)
Anesthesia Evaluation  Patient identified by MRN, date of birth, ID band Patient awake    Reviewed: Allergy & Precautions, NPO status , Patient's Chart, lab work & pertinent test results  History of Anesthesia Complications (+) PONV  Airway Mallampati: II  TM Distance: >3 FB Neck ROM: Full    Dental no notable dental hx. (+) Caps   Pulmonary neg pulmonary ROS,    Pulmonary exam normal breath sounds clear to auscultation       Cardiovascular hypertension, Pt. on medications Normal cardiovascular exam Rhythm:Regular Rate:Normal     Neuro/Psych Anxiety Depression negative neurological ROS     GI/Hepatic Neg liver ROS, hiatal hernia, GERD  Medicated and Controlled,  Endo/Other  negative endocrine ROS  Renal/GU negative Renal ROS  negative genitourinary   Musculoskeletal negative musculoskeletal ROS (+)   Abdominal   Peds negative pediatric ROS (+)  Hematology negative hematology ROS (+)   Anesthesia Other Findings   Reproductive/Obstetrics negative OB ROS                             Anesthesia Physical Anesthesia Plan  ASA: II  Anesthesia Plan: MAC   Post-op Pain Management:    Induction: Intravenous  Airway Management Planned: Simple Face Mask  Additional Equipment:   Intra-op Plan:   Post-operative Plan:   Informed Consent: I have reviewed the patients History and Physical, chart, labs and discussed the procedure including the risks, benefits and alternatives for the proposed anesthesia with the patient or authorized representative who has indicated his/her understanding and acceptance.   Dental advisory given  Plan Discussed with: CRNA  Anesthesia Plan Comments:         Anesthesia Quick Evaluation

## 2014-12-10 NOTE — Interval H&P Note (Signed)
History and Physical Interval Note:  12/10/2014 12:28 PM  April Rodriguez  has presented today for surgery, with the diagnosis of carcinoma in situ aIN grade 3     The various methods of treatment have been discussed with the patient and family. After consideration of risks, benefits and other options for treatment, the patient has consented to  Procedure(s): ANALEXAM UNDER ANESTHESIA WITH POSSIBLE BIOPSY, EXCISIONAL BIOPSY OF PERIANAL AIN (N/A) as a surgical intervention .  The patient's history has been reviewed, patient examined, no change in status, stable for surgery.  I have reviewed the patient's chart and labs.  Questions were answered to the patient's satisfaction.     Rosario Adie, MD  [facility]

## 2014-12-10 NOTE — Transfer of Care (Signed)
Immediate Anesthesia Transfer of Care Note  Patient: April Rodriguez  Procedure(s) Performed: Procedure(s) (LRB): ANAL EXAM UNDER ANESTHESIA  EXCISIONAL BIOPSY OF PERIANAL NEOPLASM (N/A)  Patient Location: PACU  Anesthesia Type: MAC  Level of Consciousness: awake, alert , oriented and patient cooperative  Airway & Oxygen Therapy: Patient Spontanous Breathing and Patient connected to face mask oxygen  Post-op Assessment: Report given to PACU RN and Post -op Vital signs reviewed and stable  Post vital signs: Reviewed and stable  Complications: No apparent anesthesia complications

## 2014-12-10 NOTE — H&P (View-Only) (Signed)
April Rodriguez 11/30/2014 10:02 AM Location: [facility] Patient #: [id number] DOB: 1956/03/12 Married / Language: Cleophus Molt / Race: White Female History of Present Illness April Ruff MD; 5/73/2202 10:13 AM) Patient words: perirectal carcinoma.  The patient is a 60 year old female who presents with anal lesions. 59 year old female who presents to the office as a referral from OB/GYN. She was noted to have a perianal lesion on her annual exam. This was biopsied and showed carcinoma in situ. She denies any symptoms from the lesion and does not know how long it had been there. She denies any bleeding. She has had some increasing constipation over the past few years. She is up-to-date on her colonoscopies. She thinks her last colonoscopy was about 6 years ago and normal. Other Problems April Lorenzo, LPN; 5/42/7062 37:62 AM) Anxiety Disorder Back Pain Bladder Problems Diverticulosis Gastroesophageal Reflux Disease Hemorrhoids High blood pressure Hypercholesterolemia Migraine Headache  Past Surgical History April Lorenzo, LPN; 12/01/5174 16:07 AM) Breast Biopsy Left. Cataract Surgery Bilateral. Foot Surgery Right. Hemorrhoidectomy  Diagnostic Studies History April Lorenzo, LPN; 3/71/0626 94:85 AM) Colonoscopy 5-10 years ago Mammogram within last year Pap Smear 1-5 years ago  Allergies April Lorenzo, LPN; 4/62/7035 00:93 AM) Erythromycin *MACROLIDES* Abdominal pain, Diarrhea.  Medication History April Lorenzo, LPN; 11/18/2991 71:69 AM) Fluticasone Furoate (100MCG/ACT Aero Pow Br Act, Inhalation as needed) Active. Meclizine HCl (12.5MG  Tablet, Oral as needed) Active. HyoMax-DT (0.375MG  Tablet ER, Oral) Active. Levocetirizine Dihydrochloride (5MG  Tablet, Oral) Active. KlonoPIN (0.5MG  Tablet, Oral) Active. Venlafaxine HCl (75MG  Tablet, Oral) Active. Pravachol (40MG  Tablet, Oral) Active. Medications Reconciled  Social History April Lorenzo, LPN; 6/78/9381 01:75 AM) Caffeine use Coffee, Tea. No alcohol use No drug use Tobacco use Never smoker.  Family History April Lorenzo, LPN; 04/03/5850 77:82 AM) Alcohol Abuse Brother. Arthritis Mother. Breast Cancer Mother. Cancer Father. Cerebrovascular Accident Brother, Father, Mother. Colon Polyps Brother. Depression Brother. Diabetes Mellitus Mother. Heart Disease Mother, Sister. Heart disease in female family member before age 16 Hypertension Mother, Sister. Melanoma Family Members In General. Migraine Headache Daughter, Sister. Prostate Cancer Father. Respiratory Condition Sister.  Pregnancy / Birth History April Lorenzo, LPN; 07/24/5359 44:31 AM) Age at menarche 39 years. Age of menopause 71-50 Gravida 2 Maternal age 49-20 Para 2     Review of Systems April Lorenzo LPN; 5/40/0867 61:95 AM) General Present- Fatigue. Not Present- Appetite Loss, Chills, Fever, Night Sweats, Weight Gain and Weight Loss. Skin Present- New Lesions. Not Present- Change in Wart/Mole, Dryness, Hives, Jaundice, Non-Healing Wounds, Rash and Ulcer. HEENT Not Present- Earache, Hearing Loss, Hoarseness, Nose Bleed, Oral Ulcers, Ringing in the Ears, Seasonal Allergies, Sinus Pain, Sore Throat, Visual Disturbances, Wears glasses/contact lenses and Yellow Eyes. Respiratory Not Present- Bloody sputum, Chronic Cough, Difficulty Breathing, Snoring and Wheezing. Breast Not Present- Breast Mass, Breast Pain, Nipple Discharge and Skin Changes. Cardiovascular Not Present- Chest Pain, Difficulty Breathing Lying Down, Leg Cramps, Palpitations, Rapid Heart Rate, Shortness of Breath and Swelling of Extremities. Gastrointestinal Present- Constipation and Hemorrhoids. Not Present- Abdominal Pain, Bloating, Bloody Stool, Change in Bowel Habits, Chronic diarrhea, Difficulty Swallowing, Excessive gas, Gets full quickly at meals, Indigestion, Nausea, Rectal Pain and Vomiting. Female  Genitourinary Not Present- Frequency, Nocturia, Painful Urination, Pelvic Pain and Urgency. Musculoskeletal Not Present- Back Pain, Joint Pain, Joint Stiffness, Muscle Pain, Muscle Weakness and Swelling of Extremities. Neurological Not Present- Decreased Memory, Fainting, Headaches, Numbness, Seizures, Tingling, Tremor, Trouble walking and Weakness. Psychiatric Not Present- Anxiety, Bipolar, Change in Sleep Pattern, Depression, Fearful and Frequent crying. Endocrine  Not Present- Cold Intolerance, Excessive Hunger, Hair Changes, Heat Intolerance, Hot flashes and New Diabetes. Hematology Not Present- Easy Bruising, Excessive bleeding, Gland problems, HIV and Persistent Infections.  Vitals April Billings Dockery LPN; 3/61/4431 54:00 AM) 11/30/2014 10:02 AM Weight: 131.4 lb Height: 63in Body Surface Area: 1.63 m Body Mass Index: 23.28 kg/m Temp.: 98.62F(Oral)  Pulse: 89 (Regular)  BP: 118/74 (Sitting, Left Arm, Standard)     Physical Exam April Ruff MD; 8/67/6195 12:57 PM)  General Mental Status-Alert. General Appearance-Consistent with stated age. Hydration-Well hydrated. Voice-Normal.  Head and Neck Head-normocephalic, atraumatic with no lesions or palpable masses. Trachea-midline. Thyroid Gland Characteristics - normal size and consistency.  Eye Eyeball - Bilateral-Extraocular movements intact. Sclera/Conjunctiva - Bilateral-No scleral icterus.  Chest and Lung Exam Chest and lung exam reveals -quiet, even and easy respiratory effort with no use of accessory muscles and on auscultation, normal breath sounds, no adventitious sounds and normal vocal resonance. Inspection Chest Wall - Normal. Back - normal.  Cardiovascular Cardiovascular examination reveals -normal heart sounds, regular rate and rhythm with no murmurs and normal pedal pulses bilaterally.  Abdomen Inspection Inspection of the abdomen reveals - No Hernias. Skin - Scar - no surgical  scars. Palpation/Percussion Palpation and Percussion of the abdomen reveal - Soft, Non Tender, No Rebound tenderness, No Rigidity (guarding) and No hepatosplenomegaly. Auscultation Auscultation of the abdomen reveals - Bowel sounds normal.  Rectal Anorectal Exam External - Note: Left anterior lesion approximately 3 cm in length and 1 cm in width with no fixation underlying tissues. No sphincter involvement.  Neurologic Neurologic evaluation reveals -alert and oriented x 3 with no impairment of recent or remote memory. Mental Status-Normal.  Musculoskeletal Normal Exam - Left-Upper Extremity Strength Normal and Lower Extremity Strength Normal. Normal Exam - Right-Upper Extremity Strength Normal and Lower Extremity Strength Normal.    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 Biopsies show carcinoma in situ. On exam I do not see any signs of invasive carcinoma or sphincter involvement. I would like to do an excision of the anal margin. We will then reassess pathology and determine if any further treatment is needed. We discussed the typical causes of these lesions as well as the typical course. All questions were answered. Risk of the procedure include bleeding, pain and infection.

## 2014-12-10 NOTE — Discharge Instructions (Addendum)

## 2014-12-10 NOTE — Op Note (Signed)
12/10/2014  1:09 PM  PATIENT:  April Rodriguez  59 y.o. female  Patient Care Team: Lottie Dawson, MD as PCP - General (Internal Medicine)  PRE-OPERATIVE DIAGNOSIS:  carcinoma in situ/AIN grade 3     POST-OPERATIVE DIAGNOSIS:  carcinoma in situ/AIN grade 3  PROCEDURE:  ANAL EXAM UNDER ANESTHESIA   EXCISIONAL BIOPSY OF PERIANAL LESION  Surgeon(s): [name], MD  ASSISTANT: none   ANESTHESIA:   local and MAC  SPECIMEN:  Source of Specimen:  R anterior perianal region  DISPOSITION OF SPECIMEN:  PATHOLOGY  COUNTS:  YES  PLAN OF CARE: Discharge to home after PACU  PATIENT DISPOSITION:  PACU - hemodynamically stable.  INDICATION: 59 y.o. F with perianal mass noted and biopsied by her gynecologist.  Biopsy showed AIN 3.  Now here for definitive excision.   OR FINDINGS: perianal lesion noted, no other signs of dysplasia  DESCRIPTION: the patient was identified in the preoperative holding area and taken to the OR where they were laid on the operating room table.  MAC anesthesia was induced without difficulty. The patient was then positioned in prone jackknife position with buttocks gently taped apart.  The patient was then prepped and draped in usual sterile fashion.  SCDs were noted to be in place prior to the initiation of anesthesia. A surgical timeout was performed indicating the correct patient, procedure, positioning and need for preoperative antibiotics.  A rectal block was performed using Marcaine with epinephrine.    I began with a digital rectal exam.  There were no masses palpated.  I placed a 5% Acetic acid soaked sponge in the anal canal and another on top to delineate the noted lesion and to detect any other areas of potential dysplasia.   I then placed a Hill-Ferguson anoscope into the anal canal and evaluated this completely.  There was minimal hemorrhoid disease.  There were no internal lesions.  The anal margin lesion was noted in the left anterior  position and ~2cm x 3cm long.  A scalpel was used to incise the skin, taking a 1-2 mm margin.  The lesion was removed from the subcutaneous tissue using cautery.  Hemostasis was good.   I mobilized small skin flaps and brought this together in the middle with a 3-0 Vicryl suture.  I then closed the skin loosely with a 3-0 Chromic running suture.  All counts were correct per OR staff.  A dressing was applied and the patient was sent to the PACU in stable condition.

## 2014-12-11 NOTE — Anesthesia Postprocedure Evaluation (Signed)
  Anesthesia Post-op Note  Patient: April Rodriguez  Procedure(s) Performed: Procedure(s) (LRB): ANAL EXAM UNDER ANESTHESIA  EXCISIONAL BIOPSY OF PERIANAL NEOPLASM (N/A)  Patient Location: PACU  Anesthesia Type: MAC  Level of Consciousness: awake and alert   Airway and Oxygen Therapy: Patient Spontanous Breathing  Post-op Pain: mild  Post-op Assessment: Post-op Vital signs reviewed, Patient's Cardiovascular Status Stable, Respiratory Function Stable, Patent Airway and No signs of Nausea or vomiting  Last Vitals:  Filed Vitals:   12/10/14 1508  BP: 152/90  Pulse: 96  Temp: 36.5 C  Resp: 16    Post-op Vital Signs: stable   Complications: No apparent anesthesia complications

## 2014-12-13 ENCOUNTER — Encounter (HOSPITAL_BASED_OUTPATIENT_CLINIC_OR_DEPARTMENT_OTHER): Payer: Self-pay | Admitting: General Surgery

## 2015-01-03 ENCOUNTER — Ambulatory Visit
Admission: RE | Admit: 2015-01-03 | Discharge: 2015-01-03 | Disposition: A | Discharge status: Home / Self Care | Payer: 59 | Source: Ambulatory Visit | Attending: Obstetrics and Gynecology | Admitting: Obstetrics and Gynecology

## 2015-01-03 DIAGNOSIS — M8580 Other specified disorders of bone density and structure, unspecified site: Secondary | ICD-10-CM

## 2015-04-04 MED FILL — VENLAFAXINE HCL 100 MG TAB: 100 | 30 days supply | Qty: 60 | Fill #8

## 2015-04-04 MED FILL — LEVOCETIRIZINE 5 MG TABLET: 5 | 30 days supply | Qty: 30 | Fill #3

## 2015-04-04 MED FILL — LOSARTAN POTASSIUM 50 MG TA: 50 | 90 days supply | Qty: 90 | Fill #1

## 2015-04-04 MED FILL — MELOXICAM 7.5 MG TABLET: 7.5 | 30 days supply | Qty: 30 | Fill #1

## 2015-04-06 DIAGNOSIS — M8580 Other specified disorders of bone density and structure, unspecified site: Secondary | ICD-10-CM | POA: Diagnosis not present

## 2015-04-19 DIAGNOSIS — D045 Carcinoma in situ of skin of trunk: Secondary | ICD-10-CM | POA: Diagnosis not present

## 2015-04-21 MED FILL — busPIRone HCL 5 MG TABS: 5 | 30 days supply | Qty: 90 | Fill #0

## 2015-05-02 MED FILL — VENLAFAXINE HCL 100 MG TAB: 100 | 30 days supply | Qty: 60 | Fill #9

## 2015-05-02 MED FILL — LEVOCETIRIZINE 5 MG TABLET: 5 | 30 days supply | Qty: 30 | Fill #4

## 2015-05-12 ENCOUNTER — Other Ambulatory Visit: Payer: Self-pay

## 2015-05-12 DIAGNOSIS — J069 Acute upper respiratory infection, unspecified: Secondary | ICD-10-CM | POA: Diagnosis not present

## 2015-05-12 DIAGNOSIS — Z1231 Encounter for screening mammogram for malignant neoplasm of breast: Secondary | ICD-10-CM

## 2015-05-12 DIAGNOSIS — H6980 Other specified disorders of Eustachian tube, unspecified ear: Secondary | ICD-10-CM | POA: Diagnosis not present

## 2015-05-14 ENCOUNTER — Telehealth: Payer: 59 | Admitting: Physician Assistant

## 2015-05-14 DIAGNOSIS — B9689 Other specified bacterial agents as the cause of diseases classified elsewhere: Secondary | ICD-10-CM

## 2015-05-14 DIAGNOSIS — J019 Acute sinusitis, unspecified: Secondary | ICD-10-CM

## 2015-05-14 MED ORDER — AMOXICILLIN-POT CLAVULANATE 875-125 MG PO TABS: 1.0000 | ORAL_TABLET | Freq: Two times a day (BID) | ORAL | Status: DC

## 2015-05-14 NOTE — Progress Notes (Signed)

## 2015-05-30 MED FILL — PRAVASTATIN NA 40 MG TAB: 40 | 90 days supply | Qty: 90 | Fill #0

## 2015-05-30 MED FILL — VENLAFAXINE HCL 100 MG TAB: 100 | 30 days supply | Qty: 60 | Fill #10

## 2015-05-30 MED FILL — LEVOCETIRIZINE 5 MG TABLET: 5 | 30 days supply | Qty: 30 | Fill #0

## 2015-06-01 MED FILL — clonazePAM 0.5 MG TABS: 0.5 | 90 days supply | Qty: 90 | Fill #1

## 2015-06-03 DIAGNOSIS — L821 Other seborrheic keratosis: Secondary | ICD-10-CM | POA: Diagnosis not present

## 2015-06-03 DIAGNOSIS — L723 Sebaceous cyst: Secondary | ICD-10-CM | POA: Diagnosis not present

## 2015-06-03 DIAGNOSIS — L738 Other specified follicular disorders: Secondary | ICD-10-CM | POA: Diagnosis not present

## 2015-06-14 MED FILL — PANTOPRAZOLE SOD DR 40 MG T: 40 | 90 days supply | Qty: 90 | Fill #1

## 2015-06-17 ENCOUNTER — Ambulatory Visit
Admission: RE | Admit: 2015-06-17 | Discharge: 2015-06-17 | Disposition: A | Discharge status: Home / Self Care | Payer: 59 | Source: Ambulatory Visit

## 2015-06-17 DIAGNOSIS — Z1231 Encounter for screening mammogram for malignant neoplasm of breast: Secondary | ICD-10-CM | POA: Diagnosis not present

## 2015-06-30 MED FILL — VENLAFAXINE HCL 100 MG TAB: 100 | 30 days supply | Qty: 60 | Fill #11

## 2015-07-04 MED FILL — LOSARTAN POTASSIUM 50 MG TA: 50 | 90 days supply | Qty: 90 | Fill #2

## 2015-07-05 DIAGNOSIS — D013 Carcinoma in situ of anus and anal canal: Secondary | ICD-10-CM | POA: Diagnosis not present

## 2015-07-05 MED FILL — LEVOCETIRIZINE 5 MG TABLET: 5 | 30 days supply | Qty: 30 | Fill #1

## 2015-08-01 MED FILL — VENLAFAXINE HCL 100 MG TAB: 100 | 90 days supply | Qty: 180 | Fill #0

## 2015-08-15 MED FILL — LEVOCETIRIZINE 5 MG TABLET: 5 | 30 days supply | Qty: 30 | Fill #0

## 2015-08-17 DIAGNOSIS — S60222A Contusion of left hand, initial encounter: Secondary | ICD-10-CM | POA: Diagnosis not present

## 2015-09-07 DIAGNOSIS — G479 Sleep disorder, unspecified: Secondary | ICD-10-CM | POA: Diagnosis not present

## 2015-09-07 DIAGNOSIS — F419 Anxiety disorder, unspecified: Secondary | ICD-10-CM | POA: Diagnosis not present

## 2015-09-07 DIAGNOSIS — I10 Essential (primary) hypertension: Secondary | ICD-10-CM | POA: Diagnosis not present

## 2015-09-12 MED FILL — MECLIZINE 25 MG TABLET: 25 | 10 days supply | Qty: 10 | Fill #0

## 2015-09-12 MED FILL — MELOXICAM 7.5 MG TABLET: 7.5 | 30 days supply | Qty: 30 | Fill #2

## 2015-09-12 MED FILL — PANTOPRAZOLE SOD DR 40 MG T: 40 | 90 days supply | Qty: 90 | Fill #2

## 2015-09-20 ENCOUNTER — Other Ambulatory Visit: Payer: Self-pay | Admitting: Pediatrics

## 2015-09-26 MED FILL — LOSARTAN POTASSIUM 50 MG TA: 50 | 90 days supply | Qty: 90 | Fill #3

## 2015-09-26 MED FILL — LEVOCETIRIZINE 5 MG TABLET: 5 | 30 days supply | Qty: 30 | Fill #1

## 2015-10-03 MED FILL — busPIRone HCL 5 MG TABS: 5 | 30 days supply | Qty: 90 | Fill #0

## 2015-10-03 MED FILL — OSCIMIN SR 0.375 MG TABLET: 0.375 | 30 days supply | Qty: 30 | Fill #1

## 2015-10-10 DIAGNOSIS — D013 Carcinoma in situ of anus and anal canal: Secondary | ICD-10-CM | POA: Diagnosis not present

## 2015-10-14 DIAGNOSIS — G479 Sleep disorder, unspecified: Secondary | ICD-10-CM | POA: Diagnosis not present

## 2015-10-14 DIAGNOSIS — R0789 Other chest pain: Secondary | ICD-10-CM | POA: Diagnosis not present

## 2015-10-14 DIAGNOSIS — E7800 Pure hypercholesterolemia, unspecified: Secondary | ICD-10-CM | POA: Diagnosis not present

## 2015-10-14 DIAGNOSIS — I10 Essential (primary) hypertension: Secondary | ICD-10-CM | POA: Diagnosis not present

## 2015-10-15 ENCOUNTER — Emergency Department (HOSPITAL_COMMUNITY)
Admission: EM | Admit: 2015-10-15 | Discharge: 2015-10-15 | Disposition: A | Discharge status: Home / Self Care | Payer: 59 | Attending: Emergency Medicine | Admitting: Emergency Medicine

## 2015-10-15 ENCOUNTER — Encounter (HOSPITAL_COMMUNITY): Payer: Self-pay

## 2015-10-15 ENCOUNTER — Emergency Department (HOSPITAL_COMMUNITY): Payer: 59

## 2015-10-15 DIAGNOSIS — I10 Essential (primary) hypertension: Secondary | ICD-10-CM | POA: Diagnosis not present

## 2015-10-15 DIAGNOSIS — R0602 Shortness of breath: Secondary | ICD-10-CM | POA: Diagnosis not present

## 2015-10-15 DIAGNOSIS — Z79899 Other long term (current) drug therapy: Secondary | ICD-10-CM | POA: Diagnosis not present

## 2015-10-15 DIAGNOSIS — F439 Reaction to severe stress, unspecified: Secondary | ICD-10-CM | POA: Insufficient documentation

## 2015-10-15 DIAGNOSIS — R0789 Other chest pain: Secondary | ICD-10-CM | POA: Insufficient documentation

## 2015-10-15 DIAGNOSIS — R079 Chest pain, unspecified: Secondary | ICD-10-CM | POA: Diagnosis not present

## 2015-10-15 LAB — BASIC METABOLIC PANEL
Anion gap: 8 (ref 5–15)
BUN: 8 mg/dL (ref 6–20)
CO2: 26 mmol/L (ref 22–32)
Calcium: 9.7 mg/dL (ref 8.9–10.3)
Chloride: 107 mmol/L (ref 101–111)
Creatinine, Ser: 0.71 mg/dL (ref 0.44–1.00)
GFR calc Af Amer: >60 mL/min (ref >60)
GFR calc non Af Amer: >60 mL/min (ref >60)
Glucose, Bld: 107 mg/dL — ABNORMAL HIGH (ref 65–99)
Potassium: 3.6 mmol/L (ref 3.5–5.1)
Sodium: 141 mmol/L (ref 135–145)

## 2015-10-15 LAB — CBC
HEMATOCRIT: 40.6 % (ref 36.0–46.0)
Hemoglobin: 13.5 g/dL (ref 12.0–15.0)
MCH: 27.9 pg (ref 26.0–34.0)
MCHC: 33.3 g/dL (ref 30.0–36.0)
MCV: 83.9 fL (ref 78.0–100.0)
PLATELETS: 222 10*3/uL (ref 150–400)
RBC: 4.84 MIL/uL (ref 3.87–5.11)
RDW: 12.8 % (ref 11.5–15.5)
WBC: 4.5 10*3/uL (ref 4.0–10.5)

## 2015-10-15 LAB — I-STAT TROPONIN, ED
TROPONIN I, POC: 0 ng/mL (ref 0.00–0.08)
Troponin i, poc: 0 ng/mL (ref 0.00–0.08)

## 2015-10-15 LAB — D-DIMER, QUANTITATIVE: D-Dimer, Quant: <0.27 ug/mL-FEU (ref 0.00–0.50)

## 2015-10-15 NOTE — Discharge Instructions (Signed)
Nonspecific Chest Pain  °Chest pain can be caused by many different conditions. There is always a chance that your pain could be related to something serious, such as a heart attack or a blood clot in your lungs. Chest pain can also be caused by conditions that are not life-threatening. If you have chest pain, it is very important to follow up with your health care provider. °CAUSES  °Chest pain can be caused by: °· Heartburn. °· Pneumonia or bronchitis. °· Anxiety or stress. °· Inflammation around your heart (pericarditis) or lung (pleuritis or pleurisy). °· A blood clot in your lung. °· A collapsed lung (pneumothorax). It can develop suddenly on its own (spontaneous pneumothorax) or from trauma to the chest. °· Shingles infection (varicella-zoster virus). °· Heart attack. °· Damage to the bones, muscles, and cartilage that make up your chest wall. This can include: °¨ Bruised bones due to injury. °¨ Strained muscles or cartilage due to frequent or repeated coughing or overwork. °¨ Fracture to one or more ribs. °¨ Sore cartilage due to inflammation (costochondritis). °RISK FACTORS  °Risk factors for chest pain may include: °· Activities that increase your risk for trauma or injury to your chest. °· Respiratory infections or conditions that cause frequent coughing. °· Medical conditions or overeating that can cause heartburn. °· Heart disease or family history of heart disease. °· Conditions or health behaviors that increase your risk of developing a blood clot. °· Having had chicken pox (varicella zoster). °SIGNS AND SYMPTOMS °Chest pain can feel like: °· Burning or tingling on the surface of your chest or deep in your chest. °· Crushing, pressure, aching, or squeezing pain. °· Dull or sharp pain that is worse when you move, cough, or take a deep breath. °· Pain that is also felt in your back, neck, shoulder, or arm, or pain that spreads to any of these areas. °Your chest pain may come and go, or it may stay  constant. °DIAGNOSIS °Lab tests or other studies may be needed to find the cause of your pain. Your health care provider may have you take a test called an ambulatory ECG (electrocardiogram). An ECG records your heartbeat patterns at the time the test is performed. You may also have other tests, such as: °· Transthoracic echocardiogram (TTE). During echocardiography, sound waves are used to create a picture of all of the heart structures and to look at how blood flows through your heart. °· Transesophageal echocardiogram (TEE). This is a more advanced imaging test that obtains images from inside your body. It allows your health care provider to see your heart in finer detail. °· Cardiac monitoring. This allows your health care provider to monitor your heart rate and rhythm in real time. °· Holter monitor. This is a portable device that records your heartbeat and can help to diagnose abnormal heartbeats. It allows your health care provider to track your heart activity for several days, if needed. °· Stress tests. These can be done through exercise or by taking medicine that makes your heart beat more quickly. °· Blood tests. °· Imaging tests. °TREATMENT  °Your treatment depends on what is causing your chest pain. Treatment may include: °· Medicines. These may include: °¨ Acid blockers for heartburn. °¨ Anti-inflammatory medicine. °¨ Pain medicine for inflammatory conditions. °¨ Antibiotic medicine, if an infection is present. °¨ Medicines to dissolve blood clots. °¨ Medicines to treat coronary artery disease. °· Supportive care for conditions that do not require medicines. This may include: °¨ Resting. °¨ Applying heat   or cold packs to injured areas. °¨ Limiting activities until pain decreases. °HOME CARE INSTRUCTIONS °· If you were prescribed an antibiotic medicine, finish it all even if you start to feel better. °· Avoid any activities that bring on chest pain. °· Do not use any tobacco products, including  cigarettes, chewing tobacco, or electronic cigarettes. If you need help quitting, ask your health care provider. °· Do not drink alcohol. °· Take medicines only as directed by your health care provider. °· Keep all follow-up visits as directed by your health care provider. This is important. This includes any further testing if your chest pain does not go away. °· If heartburn is the cause for your chest pain, you may be told to keep your head raised (elevated) while sleeping. This reduces the chance that acid will go from your stomach into your esophagus. °· Make lifestyle changes as directed by your health care provider. These may include: °¨ Getting regular exercise. Ask your health care provider to suggest some activities that are safe for you. °¨ Eating a heart-healthy diet. A registered dietitian can help you to learn healthy eating options. °¨ Maintaining a healthy weight. °¨ Managing diabetes, if necessary. °¨ Reducing stress. °SEEK MEDICAL CARE IF: °· Your chest pain does not go away after treatment. °· You have a rash with blisters on your chest. °· You have a fever. °SEEK IMMEDIATE MEDICAL CARE IF:  °· Your chest pain is worse. °· You have an increasing cough, or you cough up blood. °· You have severe abdominal pain. °· You have severe weakness. °· You faint. °· You have chills. °· You have sudden, unexplained chest discomfort. °· You have sudden, unexplained discomfort in your arms, back, neck, or jaw. °· You have shortness of breath at any time. °· You suddenly start to sweat, or your skin gets clammy. °· You feel nauseous or you vomit. °· You suddenly feel light-headed or dizzy. °· Your heart begins to beat quickly, or it feels like it is skipping beats. °These symptoms may represent a serious problem that is an emergency. Do not wait to see if the symptoms will go away. Get medical help right away. Call your local emergency services (911 in the [place] Do not drive yourself to the hospital. °  °This  information is not intended to replace advice given to you by your health care provider. Make sure you discuss any questions you have with your health care provider. °  °Document Released: 12/27/2004 Document Revised: 04/09/2014 Document Reviewed: 10/23/2013 °[employer]vier Interactive Patient Education ©2016 [employer] ° °

## 2015-10-15 NOTE — ED Notes (Signed)
Patient verbalized understanding of discharge instructions and denies any further needs or questions at this time. VS stable. Patient ambulatory with steady gait.  

## 2015-10-15 NOTE — ED Provider Notes (Signed)
CSN: [id number]     Arrival date & time 10/15/15  1416 History   First MD Initiated Contact with Patient 10/15/15 1624     Chief Complaint  Patient presents with  . Chest Pain     (Consider location/radiation/quality/duration/timing/severity/associated sxs/prior Treatment) HPI Comments: Patient with PMH of HTN, HL presents to the ED with a chief complaint of chest tightness.  She states that she has been having intermittent episodes for the past 3 days.  She states that it feels like a tingling sensation and sometimes feels palpitations.  She reports mild associated SOB.  She denies any fevers, chills, cough, nausea, radiating pain to arm or jaw, or diaphoresis.  She states that she has been under an immense amount of pressure at home with ailing family members.  She attributes her symptoms to stress and anxiety, but wanted to be checked out today to be certain.  She has an appointment with Cardiology on Tuesday to establish care.  She doesn't smoke.  She has never had a PE or DVT.  The history is provided by the patient. No language interpreter was used.    Past Medical History  Diagnosis Date  . IBS (irritable bowel syndrome)   . Allergic rhinitis   . Hypertension   . GERD (gastroesophageal reflux disease)   . History of hiatal hernia   . Anxiety   . Depression   . AIN grade III   . Chronic constipation   . Frequency of urination   . PONV (postoperative nausea and vomiting)   . Rash     UNDER BREAST   Past Surgical History  Procedure Laterality Date  . Left breast bx  1980's    BENIGN  . [place] bunionectomy right foot  03-07-2000  . Cysto/  bladder bx/  hydrodistention  01-30-2006  . Colonoscopy  12-15-2009  . Hemorroidectomy  1976  . Cataract extraction w/ intraocular lens  implant, bilateral  2011  . Tubal ligation  1980's  . Rectal exam under anesthesia N/A 12/10/2014    Procedure: ANAL EXAM UNDER ANESTHESIA  EXCISIONAL BIOPSY OF PERIANAL NEOPLASM;  Surgeon: [name], MD;  Location: [facility] [place];  Service: General;  Laterality: N/A;   Family History  Problem Relation Age of Onset  . Emphysema Sister   . Rheum arthritis Mother   . Stroke Brother   . Lung cancer Sister     Arlice Colt  . Aneurysm Sister     brain  . Breast cancer Mother   . Prostate cancer Father    Social History  Substance Use Topics  . Smoking status: Never Smoker   . Smokeless tobacco: Never Used  . Alcohol Use: Yes     Comment: RARE   OB History    No data available     Review of Systems  Respiratory: Positive for chest tightness and shortness of breath.   All other systems reviewed and are negative.     Allergies  Erythromycin  Home Medications   Prior to Admission medications   Medication Sig Start Date End Date Taking? Authorizing Provider  amoxicillin-clavulanate (AUGMENTIN) 875-125 MG tablet Take 1 tablet by mouth 2 (two) times daily. 05/14/15   Brunetta Jeans, PA-C  Cholecalciferol (VITAMIN D3) 1000 UNITS CAPS Take 1 capsule by mouth daily.    Historical Provider, MD  clonazePAM (KLONOPIN) 0.5 MG tablet Take 0.5 mg by mouth at bedtime.    Historical Provider, MD  docusate sodium (COLACE) 100 MG capsule Take  100 mg by mouth 2 (two) times daily as needed for mild constipation.    Historical Provider, MD  Estradiol (VAGIFEM) 10 MCG TABS vaginal tablet Place vaginally 2 (two) times a week.    Historical Provider, MD  HYDROcodone-acetaminophen (NORCO/VICODIN) 5-325 MG per tablet Take 1-2 tablets by mouth every 4 (four) hours as needed. [id number]   [name], MD  Hyoscyamine Sulfate 0.375 MG TBCR Take by mouth as needed.    Historical Provider, MD  loratadine (CLARITIN) 10 MG tablet Take 10 mg by mouth daily as needed for allergies.    Historical Provider, MD  losartan (COZAAR) 50 MG tablet Take 50 mg by mouth every morning.    Historical Provider, MD  MINERAL OIL PO Take by mouth as needed (2 TABLESPOONS  AS FOR CONSTIPATION).     Historical Provider, MD  pantoprazole (PROTONIX) 40 MG tablet Take 40 mg by mouth every morning.    Historical Provider, MD  pravastatin (PRAVACHOL) 40 MG tablet Take 40 mg by mouth every morning.    Historical Provider, MD  venlafaxine (EFFEXOR-XR) 75 MG 24 hr capsule Take 75 mg by mouth 2 (two) times daily.     Historical Provider, MD  vitamin B-12 (CYANOCOBALAMIN) 1000 MCG tablet Take 1,000 mcg by mouth daily.    Historical Provider, MD   BP 143/92 mmHg  Pulse 96  Temp(Src) 98 F (36.7 C) (Oral)  Resp 20  Ht 5\' 4"  (1.626 m)  Wt 58.514 kg  BMI 22.13 kg/m2  SpO2 97% Physical Exam  Constitutional: She is oriented to person, place, and time. She appears well-developed and well-nourished.  HENT:  Head: Normocephalic and atraumatic.  Eyes: Conjunctivae and EOM are normal. Pupils are equal, round, and reactive to light.  Neck: Normal range of motion. Neck supple.  Cardiovascular: Regular rhythm.  Exam reveals no gallop and no friction rub.   No murmur heard. tachycardic  Pulmonary/Chest: Effort normal and breath sounds normal. No respiratory distress. She has no wheezes. She has no rales. She exhibits no tenderness.  CTAB  Abdominal: Soft. Bowel sounds are normal. She exhibits no distension and no mass. There is no tenderness. There is no rebound and no guarding.  Musculoskeletal: Normal range of motion. She exhibits no edema or tenderness.  Neurological: She is alert and oriented to person, place, and time.  Skin: Skin is warm and dry.  Psychiatric: She has a normal mood and affect. Her behavior is normal. Judgment and thought content normal.  Nursing note and vitals reviewed.   ED Course  Procedures (including critical care time) Results for orders placed or performed during the hospital encounter of [id number]  Basic metabolic panel  Result Value Ref Range   Sodium 141 135 - 145 mmol/L   Potassium 3.6 3.5 - 5.1 mmol/L   Chloride 107 101 - 111 mmol/L   CO2 26 22 - 32 mmol/L    Glucose, Bld 107 (H) 65 - 99 mg/dL   BUN 8 6 - 20 mg/dL   Creatinine, Ser 0.71 0.44 - 1.00 mg/dL   Calcium 9.7 8.9 - 10.3 mg/dL   GFR calc non Af Amer >60 >60 mL/min   GFR calc Af Amer >60 >60 mL/min   Anion gap 8 5 - 15  CBC  Result Value Ref Range   WBC 4.5 4.0 - 10.5 K/uL   RBC 4.84 3.87 - 5.11 MIL/uL   Hemoglobin 13.5 12.0 - 15.0 g/dL   HCT 40.6 36.0 - 46.0 %   MCV 83.9  78.0 - 100.0 fL   MCH 27.9 26.0 - 34.0 pg   MCHC 33.3 30.0 - 36.0 g/dL   RDW 12.8 11.5 - 15.5 %   Platelets 222 150 - 400 K/uL  D-dimer, quantitative (not at [facility])  Result Value Ref Range   D-Dimer, Quant <0.27 0.00 - 0.50 ug/mL-FEU  I-stat troponin, ED  Result Value Ref Range   Troponin i, poc 0.00 0.00 - 0.08 ng/mL   Comment 3          I-stat troponin, ED  Result Value Ref Range   Troponin i, poc 0.00 0.00 - 0.08 ng/mL   Comment 3           Dg Chest 2 View  10/15/2015  CLINICAL DATA:  Chest pain. EXAM: CHEST  2 VIEW COMPARISON:  None. FINDINGS: The heart size and mediastinal contours are within normal limits. Both lungs are clear. No pneumothorax or pleural effusion is noted. The visualized skeletal structures are unremarkable. IMPRESSION: No active cardiopulmonary disease. Electronically Signed   By: Marijo Conception, M.D.   On: 10/15/2015 15:16     Imaging Review Dg Chest 2 View  10/15/2015  CLINICAL DATA:  Chest pain. EXAM: CHEST  2 VIEW COMPARISON:  None. FINDINGS: The heart size and mediastinal contours are within normal limits. Both lungs are clear. No pneumothorax or pleural effusion is noted. The visualized skeletal structures are unremarkable. IMPRESSION: No active cardiopulmonary disease. Electronically Signed   By: Marijo Conception, M.D.   On: 10/15/2015 15:16   I have personally reviewed and evaluated these images and lab results as part of my medical decision-making.   EKG Interpretation None      MDM   Final diagnoses:  Chest tightness  Stress at home    Patient with intermittent  chest tightness for the past 3 days with associated SOB.  Symptoms seem to be more related to anxiety and stress, however, she does have several risk factors  Basic labs are normal, but given tachycardia and SOB, will check D-dimer and delta trop.  She is symptom free at this time.   Patient still without active chest pain.  Delta troponin is negative.  D-dimer is also negative.  Doubt ACS or PE.  Plan for discharge to home with cardiology follow-up.    Patient discussed with Dr. Roderic Palau, who agrees with the plan.    Montine Circle, PA-C 10/15/15 2003  Milton Ferguson, MD 10/15/15 [phone]

## 2015-10-15 NOTE — ED Notes (Signed)
Patient here with 2 days of chest tightness and tingling with no radiation. Has appointment with cardiology on Tuesday but today while shopping had lightheadedness and chest discomfort again. Minimal pain on arrival.

## 2015-10-17 MED FILL — PRAVASTATIN NA 40 MG TAB: 40 | 90 days supply | Qty: 90 | Fill #0

## 2015-10-18 ENCOUNTER — Ambulatory Visit (INDEPENDENT_AMBULATORY_CARE_PROVIDER_SITE_OTHER): Payer: 59 | Admitting: Interventional Cardiology

## 2015-10-18 ENCOUNTER — Encounter: Payer: Self-pay | Admitting: Interventional Cardiology

## 2015-10-18 VITALS — BP 130/80 | HR 96 | Ht 64.0 in | Wt 129.4 lb

## 2015-10-18 DIAGNOSIS — R072 Precordial pain: Secondary | ICD-10-CM

## 2015-10-18 DIAGNOSIS — E785 Hyperlipidemia, unspecified: Secondary | ICD-10-CM

## 2015-10-18 MED FILL — clonazePAM 0.5 MG TABS: 0.5 | 30 days supply | Qty: 30 | Fill #0

## 2015-10-18 NOTE — Progress Notes (Signed)
Cardiology Office Note   Date:  10/18/2015   ID:  LYBERTI AFSHAR, DOB 02-Jan-1956, MRN [id number]  PCP:  Leeroy Cha    No chief complaint on file. chest pain   Wt Readings from Last 3 Encounters:  10/18/15 129 lb 6.4 oz (58.695 kg)  10/15/15 129 lb (58.514 kg)  12/10/14 129 lb 5 oz (58.656 kg)       History of Present Illness: April Rodriguez is a 60 y.o. female  Who has a family history of premature coronary artery disease. Over the past few months, she has had several episodes of substernal chest pressure. It can occur at rest. It typically occurs with emotional stress. She has not had any diaphoresis or syncope. She does have a sensation that something is going on with her heart. Several days ago, she went to the emergency room. She had a negative workup at that time but was asked to follow-up with cardiology. She saw her primary care physician. ECG showed poor R-wave progression with question of anterior MI and question of prior inferior MI.  She does not recall a prior stress test. She is not a smoker.    Past Medical History  Diagnosis Date  . IBS (irritable bowel syndrome)   . Allergic rhinitis   . Hypertension   . GERD (gastroesophageal reflux disease)   . History of hiatal hernia   . Anxiety   . Depression   . AIN grade III   . Chronic constipation   . Frequency of urination   . PONV (postoperative nausea and vomiting)   . Rash     UNDER BREAST    Past Surgical History  Procedure Laterality Date  . Left breast bx  1980's    BENIGN  . [place] bunionectomy right foot  03-07-2000  . Cysto/  bladder bx/  hydrodistention  01-30-2006  . Colonoscopy  12-15-2009  . Hemorroidectomy  1976  . Cataract extraction w/ intraocular lens  implant, bilateral  2011  . Tubal ligation  1980's  . Rectal exam under anesthesia N/A 12/10/2014    Procedure: ANAL EXAM UNDER ANESTHESIA  EXCISIONAL BIOPSY OF PERIANAL NEOPLASM;  Surgeon: [name], MD;  Location:  [place];  Service: General;  Laterality: N/A;     Current Outpatient Prescriptions  Medication Sig Dispense Refill  . aspirin 81 MG tablet Take 81 mg by mouth daily as needed for pain.    . busPIRone (BUSPAR) 5 MG tablet Take 5 mg by mouth 3 (three) times daily.    . Cholecalciferol (VITAMIN D3) 1000 UNITS CAPS Take 1 capsule by mouth daily.    . clonazePAM (KLONOPIN) 0.5 MG tablet Take 0.5 mg by mouth daily as needed for anxiety.     . clotrimazole-betamethasone (LOTRISONE) cream Apply 1 application topically 2 (two) times daily.    Marland Kitchen docusate sodium (COLACE) 100 MG capsule Take 100 mg by mouth 2 (two) times daily as needed for mild constipation.    Marland Kitchen Hyoscyamine Sulfate 0.375 MG TBCR Take 0.375 mg by mouth as needed (STOMACH SPASMS).     Marland Kitchen ibuprofen (ADVIL,MOTRIN) 200 MG tablet Take 200 mg by mouth 2 (two) times daily as needed for moderate pain.    Marland Kitchen levocetirizine (XYZAL) 5 MG tablet Take 5 mg by mouth every evening.    . meclizine (ANTIVERT) 25 MG tablet Take 25 mg by mouth as needed (DIZZINESS).     . Melatonin 3 MG CAPS Take 3-9 mg by mouth at  bedtime.    Marland Kitchen MINERAL OIL PO Take by mouth as needed (2 TABLESPOONS  AS FOR CONSTIPATION).    Marland Kitchen pantoprazole (PROTONIX) 40 MG tablet Take 40 mg by mouth every morning.    . pravastatin (PRAVACHOL) 40 MG tablet Take 40 mg by mouth every morning.    . venlafaxine (EFFEXOR) 100 MG tablet Take 100 mg by mouth 2 (two) times daily.    . vitamin B-12 (CYANOCOBALAMIN) 1000 MCG tablet Take 1,000 mcg by mouth daily.     No current facility-administered medications for this visit.    Allergies:   Erythromycin    Social History:  The patient  reports that she has never smoked. She has never used smokeless tobacco. She reports that she drinks alcohol. She reports that she does not use illicit drugs.   Family History:  The patient's family history includes Aneurysm in her sister; Breast cancer in her mother; Emphysema in her sister;  Heart attack in her mother; Lung cancer in her sister; Prostate cancer in her father; Rheum arthritis in her mother; Stroke in her brother and mother.    ROS:  Please see the history of present illness.   Otherwise, review of systems are positive for chest discomfort.   All other systems are reviewed and negative.    PHYSICAL EXAM: VS:  BP 130/80 mmHg  Pulse 96  Ht 5\' 4"  (1.626 m)  Wt 129 lb 6.4 oz (58.695 kg)  BMI 22.20 kg/m2 , BMI Body mass index is 22.2 kg/(m^2). GEN: Well nourished, well developed, in no acute distress HEENT: normal Neck: no JVD, carotid bruits, or masses Cardiac: RRR; no murmurs, rubs, or gallops,no edema  Respiratory:  clear to auscultation bilaterally, normal work of breathing GI: soft, nontender, nondistended, + BS MS: no deformity or atrophy Skin: warm and dry, no rash Neuro:  Strength and sensation are intact Psych: euthymic mood, full affect   EKG:   The ekg ordered 7/14 demonstrates normal sinus rhythm, poor R-wave progression, no acute ST segment changes   Recent Labs: 10/15/2015: BUN 8; Creatinine, Ser 0.71; Hemoglobin 13.5; Platelets 222; Potassium 3.6; Sodium 141   Lipid Panel No results found for: CHOL, TRIG, HDL, CHOLHDL, VLDL, LDLCALC, LDLDIRECT   Other studies Reviewed: Additional studies/ records that were reviewed today with results demonstrating: prior ECGs.   ASSESSMENT AND PLAN:  1. Chest pain: We discussed the various modalities of checking for ischemia including stress testing versus CT angiogram versus invasivecoronary angiography.  Her symptoms have been occurring regularly over the past several months. There are some typical and some atypical features. There is no definitive link to exertion. However, there is no sign that her symptoms are getting better. Although a stress test may be reasonable, I think we may end up doing an angiogram since her symptoms may not go away. She is in favor of doing a catheterization to be absolute  certain that there is no significant coronary pathology. Given the ECG changes as well, we agreed on cardiac cath. The risks and benefits of the procedure were explained and she is willing to proceed. We'll plan for right radial approach. Her right radial pulse is not very strong so we may end up having to use a femoral approach. 2. Hyperlipidemia: Continue pravastatin.    Current medicines are reviewed at length with the patient today.  The patient concerns regarding her medicines were addressed.  The following changes have been made:  No change  Labs/ tests ordered today include:  No  orders of the defined types were placed in this encounter.    Recommend 150 minutes/week of aerobic exercise Low fat, low carb, high fiber diet recommended  Disposition:   FU in post cath   Signed, [name], MD  10/18/2015 4:16 PM    [facility] [address], [place], [place]  [address] Phone: [phone]; Fax: [phone]

## 2015-10-18 NOTE — Patient Instructions (Signed)
Medication Instructions:  Same-no changes  Labwork: None  Testing/Procedures: Your physician has requested that you have a cardiac catheterization. Cardiac catheterization is used to diagnose and/or treat various heart conditions. Doctors may recommend this procedure for a number of different reasons. The most common reason is to evaluate chest pain. Chest pain can be a symptom of coronary artery disease (CAD), and cardiac catheterization can show whether plaque is narrowing or blocking your heart's arteries. This procedure is also used to evaluate the valves, as well as measure the blood flow and oxygen levels in different parts of your heart. For further information please visit [employer]. Please follow instruction sheet, as given.   Follow-Up: After Cath     If you need a refill on your cardiac medications before your next appointment, please call your pharmacy.

## 2015-10-19 ENCOUNTER — Other Ambulatory Visit: Payer: Self-pay | Admitting: Interventional Cardiology

## 2015-10-19 DIAGNOSIS — R072 Precordial pain: Secondary | ICD-10-CM

## 2015-10-21 ENCOUNTER — Encounter (HOSPITAL_COMMUNITY): Payer: Self-pay | Admitting: Interventional Cardiology

## 2015-10-21 ENCOUNTER — Telehealth: Payer: Self-pay | Admitting: Interventional Cardiology

## 2015-10-21 ENCOUNTER — Encounter (HOSPITAL_COMMUNITY)
Admission: RE | Disposition: A | Discharge status: Home / Self Care | Payer: Self-pay | Source: Ambulatory Visit | Attending: Interventional Cardiology

## 2015-10-21 ENCOUNTER — Ambulatory Visit (HOSPITAL_COMMUNITY)
Admission: RE | Admit: 2015-10-21 | Discharge: 2015-10-21 | Disposition: A | Discharge status: Home / Self Care | Payer: 59 | Source: Ambulatory Visit | Attending: Interventional Cardiology | Admitting: Interventional Cardiology

## 2015-10-21 DIAGNOSIS — Z7982 Long term (current) use of aspirin: Secondary | ICD-10-CM | POA: Insufficient documentation

## 2015-10-21 DIAGNOSIS — Z8249 Family history of ischemic heart disease and other diseases of the circulatory system: Secondary | ICD-10-CM | POA: Insufficient documentation

## 2015-10-21 DIAGNOSIS — I10 Essential (primary) hypertension: Secondary | ICD-10-CM | POA: Diagnosis not present

## 2015-10-21 DIAGNOSIS — Z881 Allergy status to other antibiotic agents status: Secondary | ICD-10-CM | POA: Insufficient documentation

## 2015-10-21 DIAGNOSIS — R072 Precordial pain: Secondary | ICD-10-CM | POA: Insufficient documentation

## 2015-10-21 DIAGNOSIS — E785 Hyperlipidemia, unspecified: Secondary | ICD-10-CM | POA: Insufficient documentation

## 2015-10-21 HISTORY — PX: CARDIAC CATHETERIZATION: SHX172

## 2015-10-21 LAB — PROTIME-INR
INR: 1.11 (ref 0.00–1.49)
Prothrombin Time: 14.4 seconds (ref 11.6–15.2)

## 2015-10-21 SURGERY — LEFT HEART CATH AND CORONARY ANGIOGRAPHY

## 2015-10-21 MED ORDER — ASPIRIN 81 MG PO CHEW: 81.0000 mg | CHEWABLE_TABLET | ORAL | Status: DC

## 2015-10-21 MED ORDER — SODIUM CHLORIDE 0.9 % WEIGHT BASED INFUSION
3.0000 mL/kg/h | INTRAVENOUS | Status: AC
  Administered 2015-10-21: 3 mL/kg/h via INTRAVENOUS

## 2015-10-21 MED ORDER — SODIUM CHLORIDE 0.9% FLUSH: 3.0000 mL | INTRAVENOUS | Status: DC | PRN

## 2015-10-21 MED ORDER — SODIUM CHLORIDE 0.9 % WEIGHT BASED INFUSION: 1.0000 mL/kg/h | INTRAVENOUS | Status: DC

## 2015-10-21 MED ORDER — HEPARIN SODIUM (PORCINE) 1000 UNIT/ML IJ SOLN
INTRAMUSCULAR | Status: DC | PRN
  Administered 2015-10-21: 3000 [IU] via INTRAVENOUS

## 2015-10-21 MED ORDER — SODIUM CHLORIDE 0.9 % IV SOLN: 250.0000 mL | INTRAVENOUS | Status: DC | PRN

## 2015-10-21 MED ORDER — IOPAMIDOL (ISOVUE-370) INJECTION 76%
INTRAVENOUS | Status: DC | PRN
  Administered 2015-10-21: 65 mL via INTRA_ARTERIAL

## 2015-10-21 MED ORDER — ACETAMINOPHEN 500 MG PO TABS
ORAL_TABLET | ORAL | Status: AC
  Administered 2015-10-21: 1000 mg via ORAL
  Filled 2015-10-21: qty 2

## 2015-10-21 MED ORDER — ACETAMINOPHEN 500 MG PO TABS
1000.0000 mg | ORAL_TABLET | Freq: Once | ORAL | Status: AC
  Administered 2015-10-21: 1000 mg via ORAL
  Filled 2015-10-21: qty 2

## 2015-10-21 MED ORDER — VERAPAMIL HCL 2.5 MG/ML IV SOLN
INTRAVENOUS | Status: AC
  Filled 2015-10-21: qty 2

## 2015-10-21 MED ORDER — SODIUM CHLORIDE 0.9% FLUSH
3.0000 mL | Freq: Two times a day (BID) | INTRAVENOUS | Status: DC
Start: 2015-10-21 — End: 2015-10-21

## 2015-10-21 MED ORDER — MIDAZOLAM HCL 2 MG/2ML IJ SOLN
INTRAMUSCULAR | Status: AC
  Filled 2015-10-21: qty 2

## 2015-10-21 MED ORDER — MIDAZOLAM HCL 2 MG/2ML IJ SOLN
INTRAMUSCULAR | Status: DC | PRN
Start: 2015-10-21 — End: 2015-10-21
  Administered 2015-10-21: 2 mg via INTRAVENOUS
  Administered 2015-10-21: 1 mg via INTRAVENOUS

## 2015-10-21 MED ORDER — LIDOCAINE HCL (PF) 1 % IJ SOLN
INTRAMUSCULAR | Status: AC
  Filled 2015-10-21: qty 30

## 2015-10-21 MED ORDER — IOPAMIDOL (ISOVUE-370) INJECTION 76%
INTRAVENOUS | Status: AC
  Filled 2015-10-21: qty 100

## 2015-10-21 MED ORDER — HEPARIN (PORCINE) IN NACL 2-0.9 UNIT/ML-% IJ SOLN
INTRAMUSCULAR | Status: DC | PRN
  Administered 2015-10-21: 1000 mL

## 2015-10-21 MED ORDER — FENTANYL CITRATE (PF) 100 MCG/2ML IJ SOLN
INTRAMUSCULAR | Status: AC
  Filled 2015-10-21: qty 2

## 2015-10-21 MED ORDER — HEPARIN (PORCINE) IN NACL 2-0.9 UNIT/ML-% IJ SOLN
INTRAMUSCULAR | Status: AC
  Filled 2015-10-21: qty 1000

## 2015-10-21 MED ORDER — VERAPAMIL HCL 2.5 MG/ML IV SOLN
INTRAVENOUS | Status: DC | PRN
  Administered 2015-10-21: 10 mL via INTRA_ARTERIAL

## 2015-10-21 MED ORDER — LIDOCAINE HCL (PF) 1 % IJ SOLN
INTRAMUSCULAR | Status: DC | PRN
  Administered 2015-10-21: 3 mL via INTRADERMAL

## 2015-10-21 MED ORDER — FENTANYL CITRATE (PF) 100 MCG/2ML IJ SOLN
INTRAMUSCULAR | Status: DC | PRN
  Administered 2015-10-21 (×2): 25 ug via INTRAVENOUS

## 2015-10-21 MED ORDER — SODIUM CHLORIDE 0.9% FLUSH: 3.0000 mL | Freq: Two times a day (BID) | INTRAVENOUS | Status: DC

## 2015-10-21 MED ORDER — HEPARIN SODIUM (PORCINE) 1000 UNIT/ML IJ SOLN
INTRAMUSCULAR | Status: AC
  Filled 2015-10-21: qty 1

## 2015-10-21 SURGICAL SUPPLY — 12 items
CATH INFINITI 5 FR JL3.5 (CATHETERS) ×1 IMPLANT
CATH INFINITI 5FR ANG PIGTAIL (CATHETERS) ×1 IMPLANT
CATH INFINITI JR4 5F (CATHETERS) ×1 IMPLANT
DEVICE RAD COMP TR BAND LRG (VASCULAR PRODUCTS) ×1 IMPLANT
GLIDESHEATH SLEND SS 6F .021 (SHEATH) ×1 IMPLANT
KIT HEART LEFT (KITS) ×2 IMPLANT
PACK CARDIAC CATHETERIZATION (CUSTOM PROCEDURE TRAY) ×2 IMPLANT
SYR MEDRAD MARK V 150ML (SYRINGE) ×2 IMPLANT
TRANSDUCER W/STOPCOCK (MISCELLANEOUS) ×2 IMPLANT
TUBING CIL FLEX 10 FLL-RA (TUBING) ×2 IMPLANT
WIRE HI TORQ VERSACORE-J 145CM (WIRE) ×1 IMPLANT
WIRE SAFE-T 1.5MM-J .035X260CM (WIRE) ×1 IMPLANT

## 2015-10-21 NOTE — Telephone Encounter (Signed)
Follow up  ° ° °Patient returning call back to nurse  °

## 2015-10-21 NOTE — Telephone Encounter (Signed)
F/u ° ° ° ° ° °Pt returning nurse call.  °

## 2015-10-21 NOTE — Telephone Encounter (Signed)
**Note De-identified April Rodriguez Obfuscation** LMTCB

## 2015-10-21 NOTE — Telephone Encounter (Signed)
The pt states that she has some questions to ask Dr Irish Lack and is requesting to f/u with him. She is advised that I will discuss with him on Tuesday 7/25 and call her with his recommendation on who she should f/u with. She verbalized understanding.

## 2015-10-21 NOTE — Research (Signed)
CADLAD Informed Consent   Subject Name: April Rodriguez  Subject met inclusion and exclusion criteria.  The informed consent form, study requirements and expectations were reviewed with the subject and questions and concerns were addressed prior to the signing of the consent form.  The subject verbalized understanding of the trail requirements.  The subject agreed to participate in the CADLAD trial and signed the informed consent.  The informed consent was obtained prior to performance of any protocol-specific procedures for the subject.  A copy of the signed informed consent was given to the subject and a copy was placed in the subject's medical record.  Jake Bathe Jr. 10/21/2015, 4195 AM

## 2015-10-21 NOTE — Interval H&P Note (Signed)
Cath Lab Visit (complete for each Cath Lab visit)  Clinical Evaluation Leading to the Procedure:   ACS: No.  Non-ACS:    Anginal Classification: CCS III  Anti-ischemic medical therapy: Minimal Therapy (1 class of medications)  Non-Invasive Test Results: No non-invasive testing performed  Prior CABG: No previous CABG      History and Physical Interval Note:  10/21/2015 7:41 AM  April Rodriguez  has presented today for surgery, with the diagnosis of cp  The various methods of treatment have been discussed with the patient and family. After consideration of risks, benefits and other options for treatment, the patient has consented to  Procedure(s): Left Heart Cath and Coronary Angiography (N/A) as a surgical intervention .  The patient's history has been reviewed, patient examined, no change in status, stable for surgery.  I have reviewed the patient's chart and labs.  Questions were answered to the patient's satisfaction.     April Rodriguez

## 2015-10-21 NOTE — Discharge Instructions (Signed)
Radial Site Care °Refer to this sheet in the next few weeks. These instructions provide you with information about caring for yourself after your procedure. Your health care provider may also give you more specific instructions. Your treatment has been planned according to current medical practices, but problems sometimes occur. Call your health care provider if you have any problems or questions after your procedure. °WHAT TO EXPECT AFTER THE PROCEDURE °After your procedure, it is typical to have the following: °· Bruising at the radial site that usually fades within 1-2 weeks. °· Blood collecting in the tissue (hematoma) that may be painful to the touch. It should usually decrease in size and tenderness within 1-2 weeks. °HOME CARE INSTRUCTIONS °· Take medicines only as directed by your health care provider. °· You may shower 24-48 hours after the procedure or as directed by your health care provider. Remove the bandage (dressing) and gently wash the site with plain soap and water. Pat the area dry with a clean towel. Do not rub the site, because this may cause bleeding. °· Do not take baths, swim, or use a hot tub until your health care provider approves. °· Check your insertion site every day for redness, swelling, or drainage. °· Do not apply powder or lotion to the site. °· Do not flex or bend the affected arm for 24 hours or as directed by your health care provider. °· Do not push or pull heavy objects with the affected arm for 24 hours or as directed by your health care provider. °· Do not lift over 10 lb (4.5 kg) for 5 days after your procedure or as directed by your health care provider. °· Ask your health care provider when it is okay to: °¨ Return to work or school. °¨ Resume usual physical activities or sports. °¨ Resume sexual activity. °· Do not drive home if you are discharged the same day as the procedure. Have someone else drive you. °· You may drive 24 hours after the procedure unless otherwise  instructed by your health care provider. °· Do not operate machinery or power tools for 24 hours after the procedure. °· If your procedure was done as an outpatient procedure, which means that you went home the same day as your procedure, a responsible adult should be with you for the first 24 hours after you arrive home. °· Keep all follow-up visits as directed by your health care provider. This is important. °SEEK MEDICAL CARE IF: °· You have a fever. °· You have chills. °· You have increased bleeding from the radial site. Hold pressure on the site. CALL 911 °SEEK IMMEDIATE MEDICAL CARE IF: °· You have unusual pain at the radial site. °· You have redness, warmth, or swelling at the radial site. °· You have drainage (other than a small amount of blood on the dressing) from the radial site. °· The radial site is bleeding, and the bleeding does not stop after 30 minutes of holding steady pressure on the site. °· Your arm or hand becomes pale, cool, tingly, or numb. °  °This information is not intended to replace advice given to you by your health care provider. Make sure you discuss any questions you have with your health care provider. °  °Document Released: 04/21/2010 Document Revised: 04/09/2014 Document Reviewed: 10/05/2013 °Elsevier Interactive Patient Education ©2016 [employer] ° °

## 2015-10-21 NOTE — H&P (View-Only) (Signed)
Cardiology Office Note   Date:  10/18/2015   ID:  JALESHIA AWADA, DOB [birth year], MRN [id number]  PCP:  Leeroy Cha    No chief complaint on file. chest pain   Wt Readings from Last 3 Encounters:  10/18/15 129 lb 6.4 oz (58.695 kg)  10/15/15 129 lb (58.514 kg)  12/10/14 129 lb 5 oz (58.656 kg)       History of Present Illness: April Rodriguez is a 60 y.o. female  Who has a family history of premature coronary artery disease. Over the past few months, she has had several episodes of substernal chest pressure. It can occur at rest. It typically occurs with emotional stress. She has not had any diaphoresis or syncope. She does have a sensation that something is going on with her heart. Several days ago, she went to the emergency room. She had a negative workup at that time but was asked to follow-up with cardiology. She saw her primary care physician. ECG showed poor R-wave progression with question of anterior MI and question of prior inferior MI.  She does not recall a prior stress test. She is not a smoker.    Past Medical History  Diagnosis Date  . IBS (irritable bowel syndrome)   . Allergic rhinitis   . Hypertension   . GERD (gastroesophageal reflux disease)   . History of hiatal hernia   . Anxiety   . Depression   . AIN grade III   . Chronic constipation   . Frequency of urination   . PONV (postoperative nausea and vomiting)   . Rash     UNDER BREAST    Past Surgical History  Procedure Laterality Date  . Left breast bx  1980's    BENIGN  . [place] bunionectomy right foot  03-07-2000  . Cysto/  bladder bx/  hydrodistention  01-30-2006  . Colonoscopy  12-15-2009  . Hemorroidectomy  1976  . Cataract extraction w/ intraocular lens  implant, bilateral  2011  . Tubal ligation  1980's  . Rectal exam under anesthesia N/A 12/10/2014    Procedure: ANAL EXAM UNDER ANESTHESIA  EXCISIONAL BIOPSY OF PERIANAL NEOPLASM;  Surgeon: [name], MD;  Location:  [facility];  Service: General;  Laterality: N/A;     Current Outpatient Prescriptions  Medication Sig Dispense Refill  . aspirin 81 MG tablet Take 81 mg by mouth daily as needed for pain.    . busPIRone (BUSPAR) 5 MG tablet Take 5 mg by mouth 3 (three) times daily.    . Cholecalciferol (VITAMIN D3) 1000 UNITS CAPS Take 1 capsule by mouth daily.    . clonazePAM (KLONOPIN) 0.5 MG tablet Take 0.5 mg by mouth daily as needed for anxiety.     . clotrimazole-betamethasone (LOTRISONE) cream Apply 1 application topically 2 (two) times daily.    Marland Kitchen docusate sodium (COLACE) 100 MG capsule Take 100 mg by mouth 2 (two) times daily as needed for mild constipation.    Marland Kitchen Hyoscyamine Sulfate 0.375 MG TBCR Take 0.375 mg by mouth as needed (STOMACH SPASMS).     Marland Kitchen ibuprofen (ADVIL,MOTRIN) 200 MG tablet Take 200 mg by mouth 2 (two) times daily as needed for moderate pain.    Marland Kitchen levocetirizine (XYZAL) 5 MG tablet Take 5 mg by mouth every evening.    . meclizine (ANTIVERT) 25 MG tablet Take 25 mg by mouth as needed (DIZZINESS).     . Melatonin 3 MG CAPS Take 3-9 mg by mouth at  bedtime.    Marland Kitchen MINERAL OIL PO Take by mouth as needed (2 TABLESPOONS  AS FOR CONSTIPATION).    Marland Kitchen pantoprazole (PROTONIX) 40 MG tablet Take 40 mg by mouth every morning.    . pravastatin (PRAVACHOL) 40 MG tablet Take 40 mg by mouth every morning.    . venlafaxine (EFFEXOR) 100 MG tablet Take 100 mg by mouth 2 (two) times daily.    . vitamin B-12 (CYANOCOBALAMIN) 1000 MCG tablet Take 1,000 mcg by mouth daily.     No current facility-administered medications for this visit.    Allergies:   Erythromycin    Social History:  The patient  reports that she has never smoked. She has never used smokeless tobacco. She reports that she drinks alcohol. She reports that she does not use illicit drugs.   Family History:  The patient's family history includes Aneurysm in her sister; Breast cancer in her mother; Emphysema in her sister;  Heart attack in her mother; Lung cancer in her sister; Prostate cancer in her father; Rheum arthritis in her mother; Stroke in her brother and mother.    ROS:  Please see the history of present illness.   Otherwise, review of systems are positive for chest discomfort.   All other systems are reviewed and negative.    PHYSICAL EXAM: VS:  BP 130/80 mmHg  Pulse 96  Ht 5\' 4"  (1.626 m)  Wt 129 lb 6.4 oz (58.695 kg)  BMI 22.20 kg/m2 , BMI Body mass index is 22.2 kg/(m^2). GEN: Well nourished, well developed, in no acute distress HEENT: normal Neck: no JVD, carotid bruits, or masses Cardiac: RRR; no murmurs, rubs, or gallops,no edema  Respiratory:  clear to auscultation bilaterally, normal work of breathing GI: soft, nontender, nondistended, + BS MS: no deformity or atrophy Skin: warm and dry, no rash Neuro:  Strength and sensation are intact Psych: euthymic mood, full affect   EKG:   The ekg ordered 7/14 demonstrates normal sinus rhythm, poor R-wave progression, no acute ST segment changes   Recent Labs: 10/15/2015: BUN 8; Creatinine, Ser 0.71; Hemoglobin 13.5; Platelets 222; Potassium 3.6; Sodium 141   Lipid Panel No results found for: CHOL, TRIG, HDL, CHOLHDL, VLDL, LDLCALC, LDLDIRECT   Other studies Reviewed: Additional studies/ records that were reviewed today with results demonstrating: prior ECGs.   ASSESSMENT AND PLAN:  1. Chest pain: We discussed the various modalities of checking for ischemia including stress testing versus CT angiogram versus invasivecoronary angiography.  Her symptoms have been occurring regularly over the past several months. There are some typical and some atypical features. There is no definitive link to exertion. However, there is no sign that her symptoms are getting better. Although a stress test may be reasonable, I think we may end up doing an angiogram since her symptoms may not go away. She is in favor of doing a catheterization to be absolute  certain that there is no significant coronary pathology. Given the ECG changes as well, we agreed on cardiac cath. The risks and benefits of the procedure were explained and she is willing to proceed. We'll plan for right radial approach. Her right radial pulse is not very strong so we may end up having to use a femoral approach. 2. Hyperlipidemia: Continue pravastatin.    Current medicines are reviewed at length with the patient today.  The patient concerns regarding her medicines were addressed.  The following changes have been made:  No change  Labs/ tests ordered today include:  No  orders of the defined types were placed in this encounter.    Recommend 150 minutes/week of aerobic exercise Low fat, low carb, high fiber diet recommended  Disposition:   FU in post cath   Signed, [name], MD  10/18/2015 4:16 PM    [facility] [address], [place], [place]  [address] Phone: [phone]; Fax: [phone]

## 2015-10-21 NOTE — Telephone Encounter (Signed)
New message   Patient calling regarding her s/p procedure today with Dr. Irish Lack .     Patient was inform to make an appt with MD around  8/15 .     MD is not in office that day . Offer appt with APP Ellen Henri  -pt refuse stating she only suppose to see MD.

## 2015-10-24 ENCOUNTER — Encounter: Payer: 59 | Admitting: Nurse Practitioner

## 2015-10-25 NOTE — Telephone Encounter (Signed)
F/U Pt returning nurses call.

## 2015-10-25 NOTE — Telephone Encounter (Signed)
The pt is advised and she verbalized understanding and is in agreement with plan. Appt scheduled with Truitt Merle, NP on 8/15 at 3:00.

## 2015-10-25 NOTE — Telephone Encounter (Signed)
Per Dr Irish Lack it is ok for the pt to f/u with an APP. LMTCB.

## 2015-10-26 ENCOUNTER — Encounter: Payer: Self-pay | Admitting: *Deleted

## 2015-10-26 MED FILL — HYDROCHLOROTHIAZIDE 12.5 MG: 12.5 | 90 days supply | Qty: 90 | Fill #0

## 2015-10-27 ENCOUNTER — Ambulatory Visit: Payer: 59 | Admitting: Physician Assistant

## 2015-10-27 MED FILL — LEVOCETIRIZINE 5 MG TABLET: 5 | 30 days supply | Qty: 30 | Fill #0

## 2015-10-27 MED FILL — VENLAFAXINE HCL 100 MG TAB: 100 | 90 days supply | Qty: 180 | Fill #1

## 2015-11-08 DIAGNOSIS — H52203 Unspecified astigmatism, bilateral: Secondary | ICD-10-CM | POA: Diagnosis not present

## 2015-11-09 DIAGNOSIS — R300 Dysuria: Secondary | ICD-10-CM | POA: Diagnosis not present

## 2015-11-10 MED FILL — busPIRone HCL 5 MG TABS: 5 | 30 days supply | Qty: 90 | Fill #0

## 2015-11-11 DIAGNOSIS — D013 Carcinoma in situ of anus and anal canal: Secondary | ICD-10-CM | POA: Diagnosis not present

## 2015-11-11 DIAGNOSIS — F419 Anxiety disorder, unspecified: Secondary | ICD-10-CM | POA: Diagnosis not present

## 2015-11-11 DIAGNOSIS — R0789 Other chest pain: Secondary | ICD-10-CM | POA: Diagnosis not present

## 2015-11-11 DIAGNOSIS — B373 Candidiasis of vulva and vagina: Secondary | ICD-10-CM | POA: Diagnosis not present

## 2015-11-11 MED FILL — FLUCONAZOLE 150 MG TABLET: 150 | 1 days supply | Qty: 1 | Fill #0

## 2015-11-15 ENCOUNTER — Encounter: Payer: Self-pay | Admitting: Nurse Practitioner

## 2015-11-15 ENCOUNTER — Ambulatory Visit (INDEPENDENT_AMBULATORY_CARE_PROVIDER_SITE_OTHER): Payer: 59 | Admitting: Nurse Practitioner

## 2015-11-15 VITALS — BP 120/76 | HR 92 | Ht 63.5 in | Wt 127.4 lb

## 2015-11-15 DIAGNOSIS — E785 Hyperlipidemia, unspecified: Secondary | ICD-10-CM

## 2015-11-15 DIAGNOSIS — R072 Precordial pain: Secondary | ICD-10-CM

## 2015-11-15 DIAGNOSIS — Z9889 Other specified postprocedural states: Secondary | ICD-10-CM

## 2015-11-15 MED ORDER — METOPROLOL TARTRATE 25 MG PO TABS: 12.5000 mg | ORAL_TABLET | Freq: Two times a day (BID) | ORAL | 3 refills | Status: DC

## 2015-11-15 MED FILL — clonazePAM 0.5 MG TABS: 0.5 | 30 days supply | Qty: 30 | Fill #0

## 2015-11-15 MED FILL — METOPROLOL TARTRATE 25 MG T: 25 | 90 days supply | Qty: 90 | Fill #0

## 2015-11-15 NOTE — Progress Notes (Signed)
CARDIOLOGY OFFICE NOTE  Date:  11/15/2015    April Rodriguez Date of Birth: [birth year] Medical Record [id number]  PCP:  Leeroy Cha, MD  Cardiologist:  Irish Lack  Chief Complaint  Patient presents with  . Chest Pain    Post cath visit - seen for Dr. Irish Lack    History of Present Illness: April Rodriguez is a 60 y.o. female who presents today for a post cath visit. Seen for Dr. Irish Lack.   She has had recent cardiac cath for chest pain. FH quite + for CAD. Other issues include HLD, HTN and GERD.  Comes in today. Here alone. She feels some better. Attributes her chest pain to stress with dealing with elderly parents. Father with dementia and mother with stroke. Trying to keep them at home but she is responsible for most aspects of their care. HR still a little fast and she feels this.  Not dizzy or lightheaded. Has been put on low dose HCTZ. BP has improved.   Past Medical History:  Diagnosis Date  . AIN grade III   . Allergic rhinitis   . Anxiety   . Chronic constipation   . Depression   . Frequency of urination   . GERD (gastroesophageal reflux disease)   . History of hiatal hernia   . Hypertension   . IBS (irritable bowel syndrome)   . PONV (postoperative nausea and vomiting)   . Rash    UNDER BREAST    Past Surgical History:  Procedure Laterality Date  . [place] BUNIONECTOMY RIGHT FOOT  03-07-2000  . CARDIAC CATHETERIZATION N/A 10/21/2015   Procedure: Left Heart Cath and Coronary Angiography;  Surgeon: Jettie Booze, MD;  Location: [facility];  Service: Cardiovascular;  Laterality: N/A;  . CATARACT EXTRACTION W/ INTRAOCULAR LENS  IMPLANT, BILATERAL  2011  . COLONOSCOPY  12-15-2009  . CYSTO/  BLADDER BX/  HYDRODISTENTION  01-30-2006  . HEMORROIDECTOMY  1976  . LEFT BREAST BX  1980's   BENIGN  . RECTAL EXAM UNDER ANESTHESIA N/A 12/10/2014   Procedure: ANAL EXAM UNDER ANESTHESIA  EXCISIONAL BIOPSY OF PERIANAL NEOPLASM;  Surgeon: [name], MD;  Location: [facility];  Service: General;  Laterality: N/A;  . TUBAL LIGATION  1980's     Medications: Current Outpatient Prescriptions  Medication Sig Dispense Refill  . aspirin 81 MG tablet Take 81 mg by mouth daily as needed for pain.    . busPIRone (BUSPAR) 5 MG tablet Take 5 mg by mouth 3 (three) times daily.    . Cholecalciferol (VITAMIN D3) 1000 UNITS CAPS Take 1 capsule by mouth daily.    . clonazePAM (KLONOPIN) 0.5 MG tablet Take 0.5 mg by mouth daily as needed for anxiety.     . clotrimazole-betamethasone (LOTRISONE) cream Apply 1 application topically 2 (two) times daily.    Marland Kitchen docusate sodium (COLACE) 100 MG capsule Take 100 mg by mouth 2 (two) times daily as needed for mild constipation.    . hydrochlorothiazide (MICROZIDE) 12.5 MG capsule Take 12.5 mg by mouth daily.    Marland Kitchen Hyoscyamine Sulfate 0.375 MG TBCR Take 0.375 mg by mouth as needed (STOMACH SPASMS).     Marland Kitchen ibuprofen (ADVIL,MOTRIN) 200 MG tablet Take 200 mg by mouth 2 (two) times daily as needed for moderate pain.    Marland Kitchen levocetirizine (XYZAL) 5 MG tablet Take 5 mg by mouth every evening.    Marland Kitchen losartan (COZAAR) 50 MG tablet Take 50 mg by mouth daily.    Marland Kitchen  meclizine (ANTIVERT) 25 MG tablet Take 25 mg by mouth as needed (DIZZINESS).     . Melatonin 3 MG CAPS Take 3-9 mg by mouth at bedtime.    Marland Kitchen MINERAL OIL PO Take by mouth as needed (2 TABLESPOONS  AS FOR CONSTIPATION).    Marland Kitchen pantoprazole (PROTONIX) 40 MG tablet Take 40 mg by mouth every morning.    . pravastatin (PRAVACHOL) 40 MG tablet Take 40 mg by mouth every morning.    . venlafaxine (EFFEXOR) 100 MG tablet Take 100 mg by mouth 2 (two) times daily.    . vitamin B-12 (CYANOCOBALAMIN) 1000 MCG tablet Take 1,000 mcg by mouth daily.    . metoprolol tartrate (LOPRESSOR) 25 MG tablet Take 0.5 tablets (12.5 mg total) by mouth 2 (two) times daily. 90 tablet 3   No current facility-administered medications for this visit.     Allergies: Allergies    Allergen Reactions  . Erythromycin Diarrhea    GI upset    Social History: The patient  reports that she has never smoked. She has never used smokeless tobacco. She reports that she does not drink alcohol or use drugs.   Family History: The patient's family history includes Aneurysm in her sister; Breast cancer in her mother; Emphysema in her sister; Heart attack in her mother; Lung cancer in her sister; Prostate cancer in her father; Rheum arthritis in her mother; Stroke in her brother and mother.   Review of Systems: Please see the history of present illness.   Otherwise, the review of systems is positive for none.   All other systems are reviewed and negative.   Physical Exam: VS:  BP 120/76   Pulse 92   Ht 5' 3.5" (1.613 m)   Wt 127 lb 6.4 oz (57.8 kg)   SpO2 98% Comment: at rest  BMI 22.21 kg/m  .  BMI Body mass index is 22.21 kg/m.  Wt Readings from Last 3 Encounters:  11/15/15 127 lb 6.4 oz (57.8 kg)  10/21/15 129 lb (58.5 kg)  10/18/15 129 lb 6.4 oz (58.7 kg)    General: Pleasant. Well developed, well nourished and in no acute distress.   HEENT: Normal.  Neck: Supple, no JVD, carotid bruits, or masses noted.  Cardiac: Regular rate and rhythm. Her rate is fast. No murmurs, rubs, or gallops. No edema.  Respiratory:  Lungs are clear to auscultation bilaterally with normal work of breathing.  GI: Soft and nontender.  MS: No deformity or atrophy. Gait and ROM intact.  Skin: Warm and dry. Color is normal.  Neuro:  Strength and sensation are intact and no gross focal deficits noted.  Psych: Alert, appropriate and with normal affect.   LABORATORY DATA:  EKG:  EKG is not ordered today.  Lab Results  Component Value Date   WBC 4.5 10/15/2015   HGB 13.5 10/15/2015   HCT 40.6 10/15/2015   PLT 222 10/15/2015   GLUCOSE 107 (H) 10/15/2015   NA 141 10/15/2015   K 3.6 10/15/2015   CL 107 10/15/2015   CREATININE 0.71 10/15/2015   BUN 8 10/15/2015   CO2 26 10/15/2015    INR 1.11 10/21/2015    BNP (last 3 results) No results for input(s): BNP in the last 8760 hours.  ProBNP (last 3 results) No results for input(s): PROBNP in the last 8760 hours.   Other Studies Reviewed Today:  Cardiac Cath Conclusion from 10/2015    Ost RCA to Prox RCA lesion, 10% stenosed.  The left ventricular  systolic function is normal.  No significant cortonary artery disease.   Continue aggressive preventive therapy     Assessment/Plan: 1. Chest pain - basically negative cardiac cath - to manage with CV risk factor modification. Adding low dose Metoprolol to see if we can get her symptoms under better control. I suspect stress is a big trigger for her symptoms and this was discussed at length.   2. HTN - BP ok on current regimen.  3. HLD - on statin.   4. +FH for CAD  Current medicines are reviewed with the patient today.  The patient does not have concerns regarding medicines other than what has been noted above.  The following changes have been made:  See above.  Labs/ tests ordered today include:   No orders of the defined types were placed in this encounter.    Disposition:   FU with me in 6 weeks.    Patient is agreeable to this plan and will call if any problems develop in the interim.   Signed: Burtis Junes, RN, ANP-C 11/15/2015 3:41 PM  [facility] [address] [place], [place]  [address] Phone: [phone] Fax: [phone]

## 2015-11-15 NOTE — Patient Instructions (Addendum)
We will be checking the following labs today - NONE   Medication Instructions:    Continue with your current medicines. BUT  I am adding Lopressor 25 mg to take 1/2 a tablet twice a day - this has been sent to the drug store    Testing/Procedures To Be Arranged:  N/A  Follow-Up:   See me back in 6 weeks   Other Special Instructions:   N/A    If you need a refill on your cardiac medications before your next appointment, please call your pharmacy.   Call the [facility] office at [phone] if you have any questions, problems or concerns.

## 2015-11-23 ENCOUNTER — Ambulatory Visit
Admission: RE | Admit: 2015-11-23 | Discharge: 2015-11-23 | Disposition: A | Discharge status: Home / Self Care | Payer: 59 | Source: Ambulatory Visit | Attending: Internal Medicine | Admitting: Internal Medicine

## 2015-11-23 ENCOUNTER — Other Ambulatory Visit: Payer: Self-pay | Admitting: Internal Medicine

## 2015-11-23 DIAGNOSIS — K582 Mixed irritable bowel syndrome: Secondary | ICD-10-CM | POA: Diagnosis not present

## 2015-11-23 DIAGNOSIS — R109 Unspecified abdominal pain: Secondary | ICD-10-CM

## 2015-11-23 DIAGNOSIS — R110 Nausea: Secondary | ICD-10-CM | POA: Diagnosis not present

## 2015-11-23 MED FILL — OSCIMIN SR 0.375 MG TABLET: 0.375 | 30 days supply | Qty: 30 | Fill #0

## 2015-11-23 MED FILL — ONDANSETRON HCL 4 MG TABLET: 4 | 5 days supply | Qty: 10 | Fill #0

## 2015-12-06 MED FILL — LEVOCETIRIZINE 5 MG TABLET: 5 | 30 days supply | Qty: 30 | Fill #1

## 2015-12-09 ENCOUNTER — Encounter: Payer: Self-pay | Admitting: Nurse Practitioner

## 2015-12-12 MED FILL — PANTOPRAZOLE SOD DR 40 MG T: 40 | 90 days supply | Qty: 90 | Fill #0

## 2015-12-16 MED FILL — clonazePAM 0.5 MG TABS: 0.5 | 30 days supply | Qty: 30 | Fill #0

## 2015-12-20 MED FILL — busPIRone HCL 5 MG TABS: 5 | 30 days supply | Qty: 90 | Fill #1

## 2015-12-26 ENCOUNTER — Ambulatory Visit (INDEPENDENT_AMBULATORY_CARE_PROVIDER_SITE_OTHER): Payer: 59 | Admitting: Nurse Practitioner

## 2015-12-26 ENCOUNTER — Encounter: Payer: Self-pay | Admitting: Nurse Practitioner

## 2015-12-26 VITALS — BP 130/80 | HR 73 | Ht 63.0 in | Wt 128.9 lb

## 2015-12-26 DIAGNOSIS — Z9889 Other specified postprocedural states: Secondary | ICD-10-CM

## 2015-12-26 DIAGNOSIS — E785 Hyperlipidemia, unspecified: Secondary | ICD-10-CM | POA: Diagnosis not present

## 2015-12-26 DIAGNOSIS — R0789 Other chest pain: Secondary | ICD-10-CM | POA: Diagnosis not present

## 2015-12-26 NOTE — Progress Notes (Signed)
CARDIOLOGY OFFICE NOTE  Date:  12/26/2015    April Rodriguez Date of Birth: 10-17-1955 Medical Record [id number]  PCP:  Leeroy Cha, MD (Inactive)  Cardiologist:  Starr Lake    Chief Complaint  Patient presents with  . Chest Pain    Follow up visit - seen for Dr. Irish Lack    History of Present Illness: April Rodriguez is a 60 y.o. female who presents today for a follow up visit. Seen for Dr. Irish Lack.   She has had recent cardiac cath for chest pain - see below. FH quite + for CAD. Other issues include HLD, HTN and GERD.  I saw her for her post cath visit - she felt like most of her chest pain was stress related from dealing with elderly parents. Father with dementia and mother with stroke. Trying to keep them at home and she is responsible for most aspects of their care. HR was a little fast and I added low dose Toprol to her regimen.   Comes in today. Here alone.  She notes that she is feeling better. No more chest pain. Blood pressure is good. Home situation seems a little bit more improved. Trying to get more exercise.  She is happy with how she is doing.   Past Medical History:  Diagnosis Date  . AIN grade III   . Allergic rhinitis   . Anxiety   . Chronic constipation   . Depression   . Frequency of urination   . GERD (gastroesophageal reflux disease)   . History of hiatal hernia   . Hypertension   . IBS (irritable bowel syndrome)   . PONV (postoperative nausea and vomiting)   . Rash    UNDER BREAST    Past Surgical History:  Procedure Laterality Date  . [place] BUNIONECTOMY RIGHT FOOT  03-07-2000  . CARDIAC CATHETERIZATION N/A 10/21/2015   Procedure: Left Heart Cath and Coronary Angiography;  Surgeon: Jettie Booze, MD;  Location: [facility];  Service: Cardiovascular;  Laterality: N/A;  . CATARACT EXTRACTION W/ INTRAOCULAR LENS  IMPLANT, BILATERAL  2011  . COLONOSCOPY  12-15-2009  . CYSTO/  BLADDER BX/  HYDRODISTENTION   01-30-2006  . HEMORROIDECTOMY  1976  . LEFT BREAST BX  1980's   BENIGN  . RECTAL EXAM UNDER ANESTHESIA N/A 12/10/2014   Procedure: ANAL EXAM UNDER ANESTHESIA  EXCISIONAL BIOPSY OF PERIANAL NEOPLASM;  Surgeon: [name], MD;  Location: [facility];  Service: General;  Laterality: N/A;  . TUBAL LIGATION  1980's     Medications: Current Outpatient Prescriptions  Medication Sig Dispense Refill  . aspirin 81 MG tablet Take 81 mg by mouth daily as needed for pain.    . busPIRone (BUSPAR) 5 MG tablet Take 5 mg by mouth 3 (three) times daily.    . Cholecalciferol (VITAMIN D3) 1000 UNITS CAPS Take 1 capsule by mouth daily.    . clonazePAM (KLONOPIN) 0.5 MG tablet Take 0.5 mg by mouth daily as needed for anxiety.     . clotrimazole-betamethasone (LOTRISONE) cream Apply 1 application topically as needed. Rash    . docusate sodium (COLACE) 100 MG capsule Take 100 mg by mouth 2 (two) times daily as needed for mild constipation.    . hydrochlorothiazide (MICROZIDE) 12.5 MG capsule Take 12.5 mg by mouth daily.    Marland Kitchen Hyoscyamine Sulfate 0.375 MG TBCR Take 0.375 mg by mouth as needed (STOMACH SPASMS).     Marland Kitchen ibuprofen (ADVIL,MOTRIN) 200  MG tablet Take 200 mg by mouth 2 (two) times daily as needed for moderate pain.    Marland Kitchen levocetirizine (XYZAL) 5 MG tablet Take 5 mg by mouth as needed for allergies.     Marland Kitchen losartan (COZAAR) 50 MG tablet Take 50 mg by mouth daily.    . meclizine (ANTIVERT) 25 MG tablet Take 25 mg by mouth as needed (DIZZINESS).     . Melatonin 3 MG CAPS Take 3-9 mg by mouth at bedtime.    . metoprolol tartrate (LOPRESSOR) 25 MG tablet Take 0.5 tablets (12.5 mg total) by mouth 2 (two) times daily. 90 tablet 3  . MINERAL OIL PO Take by mouth as needed (2 TABLESPOONS  AS FOR CONSTIPATION).    Marland Kitchen pantoprazole (PROTONIX) 40 MG tablet Take 40 mg by mouth every morning.    . pravastatin (PRAVACHOL) 40 MG tablet Take 40 mg by mouth every morning.    . venlafaxine (EFFEXOR) 100 MG  tablet Take 100 mg by mouth 2 (two) times daily.    . vitamin B-12 (CYANOCOBALAMIN) 1000 MCG tablet Take 1,000 mcg by mouth daily.     No current facility-administered medications for this visit.     Allergies: Allergies  Allergen Reactions  . Erythromycin Diarrhea    GI upset    Social History: The patient  reports that she has never smoked. She has never used smokeless tobacco. She reports that she does not drink alcohol or use drugs.   Family History: The patient's family history includes Aneurysm in her sister; Breast cancer in her mother; Emphysema in her sister; Heart attack in her mother; Lung cancer in her sister; Prostate cancer in her father; Rheum arthritis in her mother; Stroke in her brother and mother.   Review of Systems: Please see the history of present illness.   Otherwise, the review of systems is positive for none.   All other systems are reviewed and negative.   Physical Exam: VS:  BP 130/80   Pulse 73   Ht 5\' 3"  (1.6 m)   Wt 128 lb 14.4 oz (58.5 kg)   SpO2 98% Comment: at rest  BMI 22.83 kg/m  .  BMI Body mass index is 22.83 kg/m.  Wt Readings from Last 3 Encounters:  12/26/15 128 lb 14.4 oz (58.5 kg)  11/15/15 127 lb 6.4 oz (57.8 kg)  10/21/15 129 lb (58.5 kg)    General: Pleasant. Well developed, well nourished and in no acute distress.   HEENT: Normal.  Neck: Supple, no JVD, carotid bruits, or masses noted.  Cardiac: Regular rate and rhythm. No murmurs, rubs, or gallops. No edema.  Respiratory:  Lungs are clear to auscultation bilaterally with normal work of breathing.  GI: Soft and nontender.  MS: No deformity or atrophy. Gait and ROM intact.  Skin: Warm and dry. Color is normal.  Neuro:  Strength and sensation are intact and no gross focal deficits noted.  Psych: Alert, appropriate and with normal affect.   LABORATORY DATA:  EKG:  EKG is not ordered today.  Lab Results  Component Value Date   WBC 4.5 10/15/2015   HGB 13.5 10/15/2015     HCT 40.6 10/15/2015   PLT 222 10/15/2015   GLUCOSE 107 (H) 10/15/2015   NA 141 10/15/2015   K 3.6 10/15/2015   CL 107 10/15/2015   CREATININE 0.71 10/15/2015   BUN 8 10/15/2015   CO2 26 10/15/2015   INR 1.11 10/21/2015    BNP (last 3 results) No results  for input(s): BNP in the last 8760 hours.  ProBNP (last 3 results) No results for input(s): PROBNP in the last 8760 hours.   Other Studies Reviewed Today:  Cardiac Cath Conclusion from 10/2015    Ost RCA to Prox RCA lesion, 10% stenosed.  The left ventricular systolic function is normal.  No significant cortonary artery disease.  Continue aggressive preventive therapy     Assessment/Plan: 1. Chest pain - basically negative cardiac cath - to manage with CV risk factor modification. She is currently doing well with no symptoms.   2. HTN - BP ok on current regimen.  3. HLD - on statin.   4. +FH for CAD  5. Situational stress - seems better.   Current medicines are reviewed with the patient today.  The patient does not have concerns regarding medicines other than what has been noted above.  The following changes have been made:  See above.  Labs/ tests ordered today include:   No orders of the defined types were placed in this encounter.    Disposition:   FU with me in one year with fasting labs.   Patient is agreeable to this plan and will call if any problems develop in the interim.   Signed: Burtis Junes, RN, ANP-C 12/26/2015 8:17 AM  [facility] [address] [place], [place]  [address] Phone: [phone] Fax: [phone]

## 2015-12-26 NOTE — Patient Instructions (Addendum)
We will be checking the following labs today - NONE   Medication Instructions:    Continue with your current medicines.     Testing/Procedures To Be Arranged:  N/A  Follow-Up:   See me in one year with fasting labs    Other Special Instructions:   Remember to make some time for yourself every day    If you need a refill on your cardiac medications before your next appointment, please call your pharmacy.   Call the [facility] office at [phone] if you have any questions, problems or concerns.

## 2015-12-30 MED FILL — LOSARTAN-HCTZ 50-12.5 MG TA: 50-12.5 | 90 days supply | Qty: 90 | Fill #0

## 2016-01-04 DIAGNOSIS — N3010 Interstitial cystitis (chronic) without hematuria: Secondary | ICD-10-CM | POA: Diagnosis not present

## 2016-01-04 MED FILL — KETOROLAC 10 MG TABLET: 10 | 5 days supply | Qty: 20 | Fill #0

## 2016-01-04 MED FILL — hydrOXYzine HCL 10 MG TABS: 10 | 30 days supply | Qty: 30 | Fill #0

## 2016-01-10 DIAGNOSIS — Z01419 Encounter for gynecological examination (general) (routine) without abnormal findings: Secondary | ICD-10-CM | POA: Diagnosis not present

## 2016-01-10 DIAGNOSIS — Z6835 Body mass index (BMI) 35.0-35.9, adult: Secondary | ICD-10-CM | POA: Diagnosis not present

## 2016-01-10 DIAGNOSIS — Z1151 Encounter for screening for human papillomavirus (HPV): Secondary | ICD-10-CM | POA: Diagnosis not present

## 2016-01-10 MED FILL — YUVAFEM 10 MCG VAGINAL INSE: 10 | 60 days supply | Qty: 18 | Fill #0

## 2016-01-12 DIAGNOSIS — N3010 Interstitial cystitis (chronic) without hematuria: Secondary | ICD-10-CM | POA: Diagnosis not present

## 2016-01-18 MED FILL — clonazePAM 0.5 MG TABS: 0.5 | 30 days supply | Qty: 30 | Fill #0

## 2016-01-23 DIAGNOSIS — D013 Carcinoma in situ of anus and anal canal: Secondary | ICD-10-CM | POA: Diagnosis not present

## 2016-01-23 DIAGNOSIS — G479 Sleep disorder, unspecified: Secondary | ICD-10-CM | POA: Diagnosis not present

## 2016-01-23 DIAGNOSIS — Z0000 Encounter for general adult medical examination without abnormal findings: Secondary | ICD-10-CM | POA: Diagnosis not present

## 2016-01-23 DIAGNOSIS — K219 Gastro-esophageal reflux disease without esophagitis: Secondary | ICD-10-CM | POA: Diagnosis not present

## 2016-01-23 DIAGNOSIS — I10 Essential (primary) hypertension: Secondary | ICD-10-CM | POA: Diagnosis not present

## 2016-01-23 DIAGNOSIS — E785 Hyperlipidemia, unspecified: Secondary | ICD-10-CM | POA: Diagnosis not present

## 2016-01-23 DIAGNOSIS — N3010 Interstitial cystitis (chronic) without hematuria: Secondary | ICD-10-CM | POA: Diagnosis not present

## 2016-01-23 DIAGNOSIS — M859 Disorder of bone density and structure, unspecified: Secondary | ICD-10-CM | POA: Diagnosis not present

## 2016-01-23 DIAGNOSIS — F419 Anxiety disorder, unspecified: Secondary | ICD-10-CM | POA: Diagnosis not present

## 2016-01-24 MED FILL — busPIRone HCL 5 MG TABS: 5 | 30 days supply | Qty: 90 | Fill #2

## 2016-01-26 DIAGNOSIS — N3010 Interstitial cystitis (chronic) without hematuria: Secondary | ICD-10-CM | POA: Diagnosis not present

## 2016-01-26 MED FILL — VENLAFAXINE HCL 100 MG TAB: 100 | 90 days supply | Qty: 180 | Fill #2

## 2016-02-03 MED FILL — PRAVASTATIN NA 40 MG TAB: 40 | 90 days supply | Qty: 90 | Fill #1

## 2016-02-07 MED FILL — TOPIRAMATE 25 MG TABLET: 25 | 30 days supply | Qty: 30 | Fill #0

## 2016-02-13 MED FILL — CLOTRIMAZOLE-BETAMETHASONE: 1-0.05 | 14 days supply | Qty: 30 | Fill #0

## 2016-02-14 DIAGNOSIS — D013 Carcinoma in situ of anus and anal canal: Secondary | ICD-10-CM | POA: Diagnosis not present

## 2016-02-14 MED FILL — METOPROLOL TARTRATE 25 MG T: 25 | 90 days supply | Qty: 90 | Fill #1

## 2016-02-27 MED FILL — busPIRone HCL 5 MG TABS: 5 | 30 days supply | Qty: 90 | Fill #3

## 2016-02-29 ENCOUNTER — Other Ambulatory Visit: Payer: Self-pay | Admitting: Gastroenterology

## 2016-02-29 ENCOUNTER — Encounter (HOSPITAL_COMMUNITY): Payer: Self-pay | Admitting: *Deleted

## 2016-03-01 MED FILL — TOPIRAMATE 25 MG TABLET: 25 | 30 days supply | Qty: 30 | Fill #0

## 2016-03-05 MED FILL — clonazePAM 0.5 MG TABS: 0.5 | 30 days supply | Qty: 30 | Fill #0

## 2016-03-08 ENCOUNTER — Encounter (HOSPITAL_COMMUNITY)
Admission: RE | Disposition: A | Discharge status: Home / Self Care | Payer: Self-pay | Source: Ambulatory Visit | Attending: Gastroenterology

## 2016-03-08 ENCOUNTER — Ambulatory Visit (HOSPITAL_COMMUNITY): Payer: 59 | Admitting: Anesthesiology

## 2016-03-08 ENCOUNTER — Ambulatory Visit (HOSPITAL_COMMUNITY)
Admission: RE | Admit: 2016-03-08 | Discharge: 2016-03-08 | Disposition: A | Discharge status: Home / Self Care | Payer: 59 | Source: Ambulatory Visit | Attending: Gastroenterology | Admitting: Gastroenterology

## 2016-03-08 ENCOUNTER — Encounter (HOSPITAL_COMMUNITY): Payer: Self-pay | Admitting: *Deleted

## 2016-03-08 DIAGNOSIS — M1990 Unspecified osteoarthritis, unspecified site: Secondary | ICD-10-CM | POA: Insufficient documentation

## 2016-03-08 DIAGNOSIS — K219 Gastro-esophageal reflux disease without esophagitis: Secondary | ICD-10-CM | POA: Insufficient documentation

## 2016-03-08 DIAGNOSIS — Z1211 Encounter for screening for malignant neoplasm of colon: Secondary | ICD-10-CM | POA: Insufficient documentation

## 2016-03-08 DIAGNOSIS — K449 Diaphragmatic hernia without obstruction or gangrene: Secondary | ICD-10-CM | POA: Insufficient documentation

## 2016-03-08 DIAGNOSIS — F329 Major depressive disorder, single episode, unspecified: Secondary | ICD-10-CM | POA: Diagnosis not present

## 2016-03-08 DIAGNOSIS — F419 Anxiety disorder, unspecified: Secondary | ICD-10-CM | POA: Insufficient documentation

## 2016-03-08 DIAGNOSIS — I10 Essential (primary) hypertension: Secondary | ICD-10-CM | POA: Diagnosis not present

## 2016-03-08 DIAGNOSIS — R51 Headache: Secondary | ICD-10-CM | POA: Insufficient documentation

## 2016-03-08 DIAGNOSIS — Z79899 Other long term (current) drug therapy: Secondary | ICD-10-CM | POA: Insufficient documentation

## 2016-03-08 HISTORY — DX: Family history of other specified conditions: Z84.89

## 2016-03-08 HISTORY — DX: Headache: R51

## 2016-03-08 HISTORY — PX: COLONOSCOPY WITH PROPOFOL: SHX5780

## 2016-03-08 HISTORY — DX: Headache, unspecified: R51.9

## 2016-03-08 SURGERY — COLONOSCOPY WITH PROPOFOL
Anesthesia: Monitor Anesthesia Care

## 2016-03-08 MED ORDER — LACTATED RINGERS IV SOLN
INTRAVENOUS | Status: DC
  Administered 2016-03-08: 13:00:00 via INTRAVENOUS

## 2016-03-08 MED ORDER — MIDAZOLAM HCL 2 MG/2ML IJ SOLN
INTRAMUSCULAR | Status: AC
  Filled 2016-03-08: qty 2

## 2016-03-08 MED ORDER — ONDANSETRON HCL 4 MG/2ML IJ SOLN
INTRAMUSCULAR | Status: AC
  Filled 2016-03-08: qty 2

## 2016-03-08 MED ORDER — ONDANSETRON HCL 4 MG/2ML IJ SOLN
INTRAMUSCULAR | Status: DC | PRN
  Administered 2016-03-08: 4 mg via INTRAVENOUS

## 2016-03-08 MED ORDER — DEXAMETHASONE SODIUM PHOSPHATE 10 MG/ML IJ SOLN
INTRAMUSCULAR | Status: AC
  Filled 2016-03-08: qty 1

## 2016-03-08 MED ORDER — PROPOFOL 500 MG/50ML IV EMUL
INTRAVENOUS | Status: DC | PRN
  Administered 2016-03-08 (×2): 50 mg via INTRAVENOUS

## 2016-03-08 MED ORDER — MIDAZOLAM HCL 5 MG/5ML IJ SOLN
INTRAMUSCULAR | Status: DC | PRN
  Administered 2016-03-08: 2 mg via INTRAVENOUS

## 2016-03-08 MED ORDER — PROPOFOL 10 MG/ML IV BOLUS
INTRAVENOUS | Status: AC
  Filled 2016-03-08: qty 20

## 2016-03-08 MED ORDER — LACTATED RINGERS IV SOLN
INTRAVENOUS | Status: DC | PRN
  Administered 2016-03-08 (×2): via INTRAVENOUS

## 2016-03-08 MED ORDER — PROPOFOL 10 MG/ML IV BOLUS
INTRAVENOUS | Status: AC
  Filled 2016-03-08: qty 40

## 2016-03-08 MED ORDER — ROCURONIUM BROMIDE 50 MG/5ML IV SOSY
PREFILLED_SYRINGE | INTRAVENOUS | Status: AC
  Filled 2016-03-08: qty 5

## 2016-03-08 MED ORDER — LIDOCAINE 2% (20 MG/ML) 5 ML SYRINGE
INTRAMUSCULAR | Status: AC
  Filled 2016-03-08: qty 10

## 2016-03-08 MED ORDER — PROPOFOL 500 MG/50ML IV EMUL
INTRAVENOUS | Status: DC | PRN
  Administered 2016-03-08: 100 ug/kg/min via INTRAVENOUS

## 2016-03-08 SURGICAL SUPPLY — 22 items

## 2016-03-08 NOTE — Op Note (Signed)
[facility] Patient Name: April Rodriguez Procedure Date: 03/08/2016 MRN: [id number] Attending MD: Garlan Fair , MD Date of Birth: [birth year] CSN: [id number] Age: 60 Admit Type: Outpatient Procedure:                Colonoscopy Indications:              Screening for colorectal malignant neoplasm Providers:                Garlan Fair, MD, Vista Lawman, RN, Ralene Bathe, Technician, Arnoldo Hooker, CRNA Referring MD:              Medicines:                Propofol per Anesthesia Complications:            No immediate complications. Estimated Blood Loss:     Estimated blood loss: none. Procedure:                Pre-Anesthesia Assessment:                           - Prior to the procedure, a History and Physical                            was performed, and patient medications and                            allergies were reviewed. The patient's tolerance of                            previous anesthesia was also reviewed. The risks                            and benefits of the procedure and the sedation                            options and risks were discussed with the patient.                            All questions were answered, and informed consent                            was obtained. Prior Anticoagulants: The patient has                            taken aspirin, last dose was 1 day prior to                            procedure. ASA Grade Assessment: II - A patient                            with mild systemic disease. After reviewing the  risks and benefits, the patient was deemed in                            satisfactory condition to undergo the procedure.                           After obtaining informed consent, the colonoscope                            was passed under direct vision. Throughout the                            procedure, the patient's blood pressure, pulse, and         oxygen saturations were monitored continuously. The                            [id number] scope was introduced through                            the anus and advanced to the the cecum, identified                            by appendiceal orifice and ileocecal valve. The                            colonoscopy was technically difficult and complex                            due to significant looping. The patient tolerated                            the procedure well. The quality of the bowel                            preparation was unsatisfactory. The appendiceal                            orifice and the rectum were photographed. Findings:      The perianal and digital rectal examinations were normal.      The entire examined colon appeared normal. No large polyps pr tumors       were visualized but the colon preparation to indintify polyps was poor. Impression:               - Preparation of the colon was unsatisfactory.                           - The entire examined colon is normal.                           - No specimens collected. Moderate Sedation:      N/A- Per Anesthesia Care Recommendation:           - Patient has a contact number available for  emergencies. The signs and symptoms of potential                            delayed complications were discussed with the                            patient. Return to normal activities tomorrow.                            Written discharge instructions were provided to the                            patient.                           - Repeat optical colonoscopy is not recommended.                            Consider performing cologuard stool colon cancer                            screening test.                           - Resume previous diet.                           - Continue present medications. Procedure Code(s):        --- Professional ---                           [id number],  Colorectal cancer screening; colonoscopy on                            individual not meeting criteria for high risk Diagnosis Code(s):        --- Professional ---                           Z12.11, Encounter for screening for malignant                            neoplasm of colon CPT copyright 2016 [employer] Association. All rights reserved. The codes documented in this report are preliminary and upon coder review may  be revised to meet current compliance requirements. Earle Gell, MD Garlan Fair, MD 03/08/2016 2:55:33 PM This report has been signed electronically. Number of Addenda: 0

## 2016-03-08 NOTE — Transfer of Care (Signed)
Immediate Anesthesia Transfer of Care Note  Patient: April Rodriguez  Procedure(s) Performed: Procedure(s): COLONOSCOPY WITH PROPOFOL (N/A)  Patient Location: PACU  Anesthesia Type:MAC  Level of Consciousness:  sedated, patient cooperative and responds to stimulation  Airway & Oxygen Therapy:Patient Spontanous Breathing and Patient connected to face mask oxgen  Post-op Assessment:  Report given to PACU RN and Post -op Vital signs reviewed and stable  Post vital signs:  Reviewed and stable  Last Vitals:  Vitals:   03/08/16 1245  BP: 126/84  Pulse: 71  Resp: 17  Temp: [id number] C    Complications: No apparent anesthesia complications

## 2016-03-08 NOTE — Discharge Instructions (Signed)

## 2016-03-08 NOTE — Anesthesia Preprocedure Evaluation (Signed)
Anesthesia Evaluation  Patient identified by MRN, date of birth, ID band Patient awake    Reviewed: Allergy & Precautions, NPO status , Patient's Chart, lab work & pertinent test results, reviewed documented beta blocker date and time   History of Anesthesia Complications (+) PONV and history of anesthetic complications  Airway Mallampati: II  TM Distance: >3 FB Neck ROM: Full    Dental no notable dental hx. (+) Caps   Pulmonary neg pulmonary ROS,    Pulmonary exam normal breath sounds clear to auscultation       Cardiovascular hypertension, Pt. on medications and Pt. on home beta blockers (-) CAD Normal cardiovascular exam Rhythm:Regular Rate:Normal     Neuro/Psych  Headaches, Anxiety Depression negative neurological ROS     GI/Hepatic Neg liver ROS, hiatal hernia, GERD  Medicated and Controlled,  Endo/Other  negative endocrine ROS  Renal/GU negative Renal ROS  negative genitourinary   Musculoskeletal negative musculoskeletal ROS (+)   Abdominal   Peds negative pediatric ROS (+)  Hematology negative hematology ROS (+)   Anesthesia Other Findings   Reproductive/Obstetrics negative OB ROS                             Anesthesia Physical  Anesthesia Plan  ASA: II  Anesthesia Plan: MAC   Post-op Pain Management:    Induction: Intravenous  Airway Management Planned: Simple Face Mask  Additional Equipment:   Intra-op Plan:   Post-operative Plan:   Informed Consent: I have reviewed the patients History and Physical, chart, labs and discussed the procedure including the risks, benefits and alternatives for the proposed anesthesia with the patient or authorized representative who has indicated his/her understanding and acceptance.   Dental advisory given  Plan Discussed with: CRNA  Anesthesia Plan Comments:         Anesthesia Quick Evaluation

## 2016-03-08 NOTE — H&P (Signed)
Procedure: Screening colonoscopy. 12/15/2009 normal screening colonoscopy was performed by the colon preparation was fair.  History: The patient is a 60 year old female born [birth year]. She is scheduled to undergo a repeat screening colonoscopy today  Past medical history: Bunionectomy. Tubal ligation. Cataract surgery. Anal carcinoma incision performed in September 2016. Irritable bowel syndrome. Migraine headache syndrome. Osteoarthritis. Allergic sinusitis and rhinitis. Depression.  Medication allergies: Erythromycin caused abdominal pain with diarrhea  Exam: The patient is alert and lying comfortably on the endoscopy stretcher. Cardiac exam reveals a regular rhythm. Lungs are clear to auscultation. Abdomen is soft, flat, and nontender to palpation.  Recommendation: Schedule repeat screening colonoscopy

## 2016-03-08 NOTE — Anesthesia Postprocedure Evaluation (Signed)
Anesthesia Post Note  Patient: April Rodriguez  Procedure(s) Performed: Procedure(s) (LRB): COLONOSCOPY WITH PROPOFOL (N/A)  Patient location during evaluation: PACU Anesthesia Type: MAC Level of consciousness: awake and alert Pain management: pain level controlled Vital Signs Assessment: post-procedure vital signs reviewed and stable Respiratory status: spontaneous breathing Cardiovascular status: stable Anesthetic complications: no    Last Vitals:  Vitals:   03/08/16 1500 03/08/16 1510  BP: 116/74 136/82  Pulse: 73 69  Resp: 17 16  Temp:      Last Pain:  Vitals:   03/08/16 1245  TempSrc: Oral                 Nolon Nations

## 2016-03-09 ENCOUNTER — Encounter (HOSPITAL_COMMUNITY): Payer: Self-pay | Admitting: Gastroenterology

## 2016-03-28 MED FILL — YUVAFEM 10 MCG VAGINAL INSE: 10 | 60 days supply | Qty: 18 | Fill #1

## 2016-03-28 MED FILL — busPIRone HCL 5 MG TABS: 5 | 30 days supply | Qty: 90 | Fill #4

## 2016-03-29 MED FILL — TOPIRAMATE 25 MG TABLET: 25 | 30 days supply | Qty: 30 | Fill #1

## 2016-04-09 MED FILL — LOSARTAN-HCTZ 50-12.5 MG TA: 50-12.5 | 90 days supply | Qty: 90 | Fill #0

## 2016-04-20 DIAGNOSIS — N3010 Interstitial cystitis (chronic) without hematuria: Secondary | ICD-10-CM | POA: Diagnosis not present

## 2016-04-23 MED FILL — TOPIRAMATE 25 MG TABLET: 25 | 30 days supply | Qty: 30 | Fill #2

## 2016-04-23 MED FILL — PRAVASTATIN NA 40 MG TAB: 40 | 90 days supply | Qty: 90 | Fill #2

## 2016-04-23 MED FILL — PANTOPRAZOLE SOD DR 40 MG T: 40 | 90 days supply | Qty: 90 | Fill #1

## 2016-04-24 MED FILL — VENLAFAXINE HCL 100 MG TAB: 100 | 90 days supply | Qty: 180 | Fill #0

## 2016-04-26 DIAGNOSIS — N3010 Interstitial cystitis (chronic) without hematuria: Secondary | ICD-10-CM | POA: Diagnosis not present

## 2016-05-02 MED FILL — busPIRone HCL 5 MG TABS: 5 | 30 days supply | Qty: 90 | Fill #5

## 2016-05-04 MED FILL — clonazePAM 0.5 MG TABS: 0.5 | 30 days supply | Qty: 15 | Fill #0

## 2016-05-10 ENCOUNTER — Other Ambulatory Visit: Payer: Self-pay | Admitting: Obstetrics and Gynecology

## 2016-05-10 DIAGNOSIS — Z1231 Encounter for screening mammogram for malignant neoplasm of breast: Secondary | ICD-10-CM

## 2016-05-28 MED FILL — TOPIRAMATE 25 MG TABLET: 25 | 30 days supply | Qty: 30 | Fill #3

## 2016-06-04 MED FILL — busPIRone HCL 5 MG TABS: 5 | 30 days supply | Qty: 90 | Fill #6

## 2016-06-18 ENCOUNTER — Ambulatory Visit
Admission: RE | Admit: 2016-06-18 | Discharge: 2016-06-18 | Disposition: A | Discharge status: Home / Self Care | Payer: 59 | Source: Ambulatory Visit | Attending: Obstetrics and Gynecology | Admitting: Obstetrics and Gynecology

## 2016-06-18 DIAGNOSIS — Z1231 Encounter for screening mammogram for malignant neoplasm of breast: Secondary | ICD-10-CM

## 2016-06-18 DIAGNOSIS — D013 Carcinoma in situ of anus and anal canal: Secondary | ICD-10-CM | POA: Diagnosis not present

## 2016-06-27 MED FILL — YUVAFEM 10 MCG VAGINAL INSE: 10 | 60 days supply | Qty: 18 | Fill #2

## 2016-06-27 MED FILL — MECLIZINE 25 MG TABLET: 25 | 10 days supply | Qty: 10 | Fill #1

## 2016-06-27 MED FILL — TOPIRAMATE 25 MG TABLET: 25 | 30 days supply | Qty: 30 | Fill #4

## 2016-06-27 MED FILL — METOPROLOL TARTRATE 25 MG T: 25 | 90 days supply | Qty: 90 | Fill #2

## 2016-06-27 MED FILL — LEVOCETIRIZINE 5 MG TABLET: 5 | 30 days supply | Qty: 30 | Fill #2

## 2016-07-09 MED FILL — LOSARTAN-HCTZ 50-12.5 MG TA: 50-12.5 | 90 days supply | Qty: 90 | Fill #0

## 2016-07-09 MED FILL — OSCIMIN SR 0.375 MG TABLET: 0.375 | 30 days supply | Qty: 30 | Fill #1

## 2016-07-09 MED FILL — busPIRone HCL 5 MG TABS: 5 | 30 days supply | Qty: 90 | Fill #0

## 2016-07-19 MED FILL — VENLAFAXINE HCL 100 MG TAB: 100 | 90 days supply | Qty: 180 | Fill #1

## 2016-07-23 DIAGNOSIS — G43909 Migraine, unspecified, not intractable, without status migrainosus: Secondary | ICD-10-CM | POA: Diagnosis not present

## 2016-07-23 DIAGNOSIS — F411 Generalized anxiety disorder: Secondary | ICD-10-CM | POA: Diagnosis not present

## 2016-07-23 DIAGNOSIS — N3010 Interstitial cystitis (chronic) without hematuria: Secondary | ICD-10-CM | POA: Diagnosis not present

## 2016-07-23 DIAGNOSIS — E785 Hyperlipidemia, unspecified: Secondary | ICD-10-CM | POA: Diagnosis not present

## 2016-07-23 DIAGNOSIS — K219 Gastro-esophageal reflux disease without esophagitis: Secondary | ICD-10-CM | POA: Diagnosis not present

## 2016-07-23 DIAGNOSIS — I10 Essential (primary) hypertension: Secondary | ICD-10-CM | POA: Diagnosis not present

## 2016-07-23 DIAGNOSIS — J029 Acute pharyngitis, unspecified: Secondary | ICD-10-CM | POA: Diagnosis not present

## 2016-07-23 DIAGNOSIS — Z23 Encounter for immunization: Secondary | ICD-10-CM | POA: Diagnosis not present

## 2016-08-06 MED FILL — TOPIRAMATE 25 MG TABLET: 25 | 30 days supply | Qty: 30 | Fill #5

## 2016-08-07 MED FILL — PRAVASTATIN NA 40 MG TAB: 40 | 90 days supply | Qty: 90 | Fill #0

## 2016-08-17 MED FILL — busPIRone HCL 5 MG TABS: 5 | 30 days supply | Qty: 90 | Fill #1

## 2016-09-04 MED FILL — KETOROLAC 10 MG TABLET: 10 | 5 days supply | Qty: 20 | Fill #0

## 2016-09-14 MED FILL — TOPIRAMATE 25 MG TABLET: 25 | 30 days supply | Qty: 30 | Fill #0

## 2016-09-25 MED FILL — LOSARTAN-HCTZ 50-12.5 MG TA: 50-12.5 | 90 days supply | Qty: 90 | Fill #1

## 2016-09-25 MED FILL — busPIRone HCL 5 MG TABS: 5 | 30 days supply | Qty: 90 | Fill #0

## 2016-10-08 MED FILL — YUVAFEM 10 MCG VAGINAL INSE: 10 | 60 days supply | Qty: 18 | Fill #3

## 2016-10-08 MED FILL — TOPIRAMATE 25 MG TABLET: 25 | 30 days supply | Qty: 30 | Fill #1

## 2016-10-16 MED FILL — URO-MP CAPSULE: 118 | 10 days supply | Qty: 30 | Fill #0

## 2016-10-22 MED FILL — VENLAFAXINE HCL 100 MG TAB: 100 | 90 days supply | Qty: 180 | Fill #2

## 2016-10-22 MED FILL — PANTOPRAZOLE SOD DR 40 MG T: 40 | 90 days supply | Qty: 90 | Fill #2

## 2016-10-23 DIAGNOSIS — N3010 Interstitial cystitis (chronic) without hematuria: Secondary | ICD-10-CM | POA: Diagnosis not present

## 2016-10-29 MED FILL — busPIRone HCL 5 MG TABS: 5 | 30 days supply | Qty: 90 | Fill #1

## 2016-11-14 MED FILL — PRAVASTATIN NA 40 MG TAB: 40 | 90 days supply | Qty: 90 | Fill #1

## 2016-11-14 MED FILL — TOPIRAMATE 25 MG TABLET: 25 | 30 days supply | Qty: 30 | Fill #2

## 2016-11-20 DIAGNOSIS — N3010 Interstitial cystitis (chronic) without hematuria: Secondary | ICD-10-CM | POA: Diagnosis not present

## 2016-12-10 MED FILL — TOPIRAMATE 25 MG TABLET: 25 | 30 days supply | Qty: 30 | Fill #3

## 2016-12-10 MED FILL — URO-MP CAPSULE: 118 | 10 days supply | Qty: 30 | Fill #1

## 2016-12-10 MED FILL — busPIRone HCL 5 MG TABS: 5 | 30 days supply | Qty: 90 | Fill #0

## 2016-12-11 DIAGNOSIS — D013 Carcinoma in situ of anus and anal canal: Secondary | ICD-10-CM | POA: Diagnosis not present

## 2017-01-01 ENCOUNTER — Other Ambulatory Visit: Payer: Self-pay | Admitting: Nurse Practitioner

## 2017-01-01 MED FILL — LOSARTAN-HCTZ 50-12.5 MG TA: 50-12.5 | 90 days supply | Qty: 90 | Fill #2

## 2017-01-02 MED FILL — METOPROLOL TARTRATE 25 MG T: 25 | 90 days supply | Qty: 90 | Fill #0

## 2017-01-10 MED FILL — CLOTRIMAZOLE-BETAMETHASONE: 1-0.05 | 14 days supply | Qty: 30 | Fill #0

## 2017-01-14 MED FILL — VENLAFAXINE HCL 100 MG TAB: 100 | 90 days supply | Qty: 180 | Fill #3

## 2017-01-18 MED FILL — busPIRone HCL 5 MG TABS: 5 | 30 days supply | Qty: 90 | Fill #1

## 2017-01-18 MED FILL — TOPIRAMATE 25 MG TAB: 25 | 30 days supply | Qty: 30 | Fill #4

## 2017-01-21 ENCOUNTER — Ambulatory Visit (INDEPENDENT_AMBULATORY_CARE_PROVIDER_SITE_OTHER): Payer: 59 | Admitting: Nurse Practitioner

## 2017-01-21 ENCOUNTER — Encounter: Payer: Self-pay | Admitting: Nurse Practitioner

## 2017-01-21 VITALS — BP 124/78 | HR 81 | Ht 63.0 in | Wt 127.0 lb

## 2017-01-21 DIAGNOSIS — R000 Tachycardia, unspecified: Secondary | ICD-10-CM | POA: Diagnosis not present

## 2017-01-21 DIAGNOSIS — E785 Hyperlipidemia, unspecified: Secondary | ICD-10-CM | POA: Diagnosis not present

## 2017-01-21 DIAGNOSIS — F439 Reaction to severe stress, unspecified: Secondary | ICD-10-CM | POA: Diagnosis not present

## 2017-01-21 DIAGNOSIS — I10 Essential (primary) hypertension: Secondary | ICD-10-CM

## 2017-01-21 NOTE — Patient Instructions (Addendum)
We will be checking the following labs today - NONE   Medication Instructions:    Continue with your current medicines.     Testing/Procedures To Be Arranged:  N/A  Follow-Up:   See me in one year. You are scheduled for Monday, January 20, 2018 at 8:00 am    Other Special Instructions:   N/A    If you need a refill on your cardiac medications before your next appointment, please call your pharmacy.   Call the [place] office at [phone] if you have any questions, problems or concerns.

## 2017-01-21 NOTE — Progress Notes (Signed)
CARDIOLOGY OFFICE NOTE  Date:  01/21/2017    [name] Date of Birth: 11-Apr-1955 Medical Record #[id number]  PCP:  Leeroy Cha, MD  Cardiologist:  Starr Lake    Chief Complaint  Patient presents with  . Hypertension  . Hyperlipidemia  . Palpitations    1 year check - seen for Dr. Irish Lack    History of Present Illness: April Rodriguez is a 61 y.o. female who presents today for a one year check. Seen for Dr. Irish Lack.   She has had prior cardiac cath back in 2017 for chest pain - see below. FH quite + for CAD - sister with prior stents. Other issues include HLD, HTN and GERD.  I saw her for her post cath visit - she felt like most of her chest pain was stress related from dealing with elderly parents. Father with dementia and mother with stroke. Trying to keep them at home and she is responsible for most aspects of their care. HR was a little fast and I added low dose Toprol to her regimen. Last seen back in September and she was doing well.   Comes in today. Here alone.  Still with lots of stress in her life - her husband now has lung cancer. Both her parents have been placed in a nursing facility. She is still working. No real chest pain. BP is good. Has some issues with sleep. Having her physical next week at [facility] with labs. Overall, she feels like she is doing ok - just overwhelmed at times which is totally understandable.   Past Medical History:  Diagnosis Date  . AIN grade III   . Allergic rhinitis   . Anxiety   . Chronic constipation   . Depression   . Family history of adverse reaction to anesthesia    sister gets nauseated  . Frequency of urination   . GERD (gastroesophageal reflux disease)   . Headache    migraines  . History of hiatal hernia   . Hypertension   . IBS (irritable bowel syndrome)   . PONV (postoperative nausea and vomiting)   . Rash    UNDER BREAST    Past Surgical History:  Procedure Laterality Date  .  [place] BUNIONECTOMY RIGHT FOOT  03-07-2000  . BREAST BIOPSY    . CARDIAC CATHETERIZATION N/A 10/21/2015   Procedure: Left Heart Cath and Coronary Angiography;  Surgeon: Jettie Booze, MD;  Location: [facility];  Service: Cardiovascular;  Laterality: N/A;  . CATARACT EXTRACTION W/ INTRAOCULAR LENS  IMPLANT, BILATERAL  2011  . COLONOSCOPY  12-15-2009  . COLONOSCOPY WITH PROPOFOL N/A 03/08/2016   Procedure: COLONOSCOPY WITH PROPOFOL;  Surgeon: Garlan Fair, MD;  Location: [facility];  Service: Endoscopy;  Laterality: N/A;  . CYSTO/  BLADDER BX/  HYDRODISTENTION  01-30-2006  . HEMORROIDECTOMY  1976  . LEFT BREAST BX  1980's   BENIGN  . RECTAL EXAM UNDER ANESTHESIA N/A 12/10/2014   Procedure: ANAL EXAM UNDER ANESTHESIA  EXCISIONAL BIOPSY OF PERIANAL NEOPLASM;  Surgeon: [name], MD;  Location: [facility];  Service: General;  Laterality: N/A;  . TUBAL LIGATION  1980's     Medications: Current Meds  Medication Sig  . aspirin 81 MG tablet Take 81 mg by mouth daily as needed for pain.  . busPIRone (BUSPAR) 5 MG tablet Take 5 mg by mouth 3 (three) times daily.  . Cholecalciferol (VITAMIN D3) 1000 UNITS CAPS Take 1 capsule  by mouth daily.  . clotrimazole-betamethasone (LOTRISONE) cream Apply 1 application topically as needed. Rash  . docusate sodium (COLACE) 100 MG capsule Take 100 mg by mouth 2 (two) times daily as needed for mild constipation.  . hydrochlorothiazide (MICROZIDE) 12.5 MG capsule Take 12.5 mg by mouth daily.  Marland Kitchen Hyoscyamine Sulfate 0.375 MG TBCR Take 0.375 mg by mouth as needed (STOMACH SPASMS).   Marland Kitchen losartan (COZAAR) 50 MG tablet Take 50 mg by mouth daily.  . meclizine (ANTIVERT) 25 MG tablet Take 25 mg by mouth as needed (DIZZINESS).   . Melatonin 3 MG CAPS Take 3-9 mg by mouth at bedtime.  . metoprolol tartrate (LOPRESSOR) 25 MG tablet TAKE 1/2 TABLET BY MOUTH TWICE DAILY  . MINERAL OIL PO Take by mouth as needed (2 TABLESPOONS  AS FOR  CONSTIPATION).  Marland Kitchen pantoprazole (PROTONIX) 40 MG tablet Take 40 mg by mouth every morning.  . pravastatin (PRAVACHOL) 40 MG tablet Take 40 mg by mouth every morning.  . topiramate (TOPAMAX) 25 MG tablet Take 25 mg by mouth daily.  Marland Kitchen venlafaxine (EFFEXOR) 100 MG tablet Take 100 mg by mouth 2 (two) times daily.  . vitamin B-12 (CYANOCOBALAMIN) 1000 MCG tablet Take 1,000 mcg by mouth daily.     Allergies: Allergies  Allergen Reactions  . Erythromycin Diarrhea    GI upset    Social History: The patient  reports that she has never smoked. She has never used smokeless tobacco. She reports that she does not drink alcohol or use drugs.   Family History: The patient's family history includes Aneurysm in her sister; Breast cancer in her mother; Emphysema in her sister; Heart attack in her mother; Lung cancer in her sister; Prostate cancer in her father; Rheum arthritis in her mother; Stroke in her brother and mother.   Review of Systems: Please see the history of present illness.   Otherwise, the review of systems is positive for none.   All other systems are reviewed and negative.   Physical Exam: VS:  BP 124/78   Pulse 81   Ht 5\' 3"  (1.6 m)   Wt 127 lb (57.6 kg)   BMI 22.50 kg/m  .  BMI Body mass index is 22.5 kg/m.  Wt Readings from Last 3 Encounters:  01/21/17 127 lb (57.6 kg)  03/08/16 128 lb 15.5 oz (58.5 kg)  12/26/15 128 lb 14.4 oz (58.5 kg)    General: Pleasant. Well developed, well nourished and in no acute distress.   HEENT: Normal.  Neck: Supple, no JVD, carotid bruits, or masses noted.  Cardiac: Regular rate and rhythm. No murmurs, rubs, or gallops. No edema.  Respiratory:  Lungs are clear to auscultation bilaterally with normal work of breathing.  GI: Soft and nontender.  MS: No deformity or atrophy. Gait and ROM intact.  Skin: Warm and dry. Color is normal.  Neuro:  Strength and sensation are intact and no gross focal deficits noted.  Psych: Alert, appropriate and  with normal affect.   LABORATORY DATA:  EKG:  EKG is ordered today. This demonstrates NSR, inferior Q's, RSR' - unchanged.  Lab Results  Component Value Date   WBC 4.5 10/15/2015   HGB 13.5 10/15/2015   HCT 40.6 10/15/2015   PLT 222 10/15/2015   GLUCOSE 107 (H) 10/15/2015   NA 141 10/15/2015   K 3.6 10/15/2015   CL 107 10/15/2015   CREATININE 0.71 10/15/2015   BUN 8 10/15/2015   CO2 26 10/15/2015   INR 1.11 10/21/2015  BNP (last 3 results) No results for input(s): BNP in the last 8760 hours.  ProBNP (last 3 results) No results for input(s): PROBNP in the last 8760 hours.   Other Studies Reviewed Today:  Cardiac Cath Conclusion from 10/2015   Ost RCA to Prox RCA lesion, 10% stenosed.  The left ventricular systolic function is normal.  No significant cortonary artery disease.  Continue aggressive preventive therapy     Assessment/Plan:  1. 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 She is currently doing well with no symptoms. No changes made today.   2. HTN - BP ok on current regimen.  3. HLD - on statin. Labs next week with her PCP.  4. +FH for CAD  5. Situational stress - discussed at length.    Current medicines are reviewed with the patient today.  The patient does not have concerns regarding medicines other than what has been noted above.  The following changes have been made:  See above.  Labs/ tests ordered today include:    Orders Placed This Encounter  Procedures  . EKG 12-Lead     Disposition:   FU with me in 1 year.   Patient is agreeable to this plan and will call if any problems develop in the interim.   SignedTruitt Merle, NP  01/21/2017 8:35 AM  [facility] [address] [place], [place]  [address] Phone: [phone] Fax: [phone]

## 2017-01-24 DIAGNOSIS — E785 Hyperlipidemia, unspecified: Secondary | ICD-10-CM | POA: Diagnosis not present

## 2017-01-24 DIAGNOSIS — I10 Essential (primary) hypertension: Secondary | ICD-10-CM | POA: Diagnosis not present

## 2017-01-25 MED FILL — tiZANidine HCL 4 MG TABS: 4 | 3 days supply | Qty: 10 | Fill #0

## 2017-01-28 DIAGNOSIS — N3010 Interstitial cystitis (chronic) without hematuria: Secondary | ICD-10-CM | POA: Diagnosis not present

## 2017-01-28 DIAGNOSIS — Z23 Encounter for immunization: Secondary | ICD-10-CM | POA: Diagnosis not present

## 2017-01-28 DIAGNOSIS — M85859 Other specified disorders of bone density and structure, unspecified thigh: Secondary | ICD-10-CM | POA: Diagnosis not present

## 2017-01-28 DIAGNOSIS — K219 Gastro-esophageal reflux disease without esophagitis: Secondary | ICD-10-CM | POA: Diagnosis not present

## 2017-01-28 DIAGNOSIS — G4700 Insomnia, unspecified: Secondary | ICD-10-CM | POA: Diagnosis not present

## 2017-01-28 DIAGNOSIS — G43909 Migraine, unspecified, not intractable, without status migrainosus: Secondary | ICD-10-CM | POA: Diagnosis not present

## 2017-01-28 DIAGNOSIS — Z0000 Encounter for general adult medical examination without abnormal findings: Secondary | ICD-10-CM | POA: Diagnosis not present

## 2017-01-28 DIAGNOSIS — I10 Essential (primary) hypertension: Secondary | ICD-10-CM | POA: Diagnosis not present

## 2017-01-28 DIAGNOSIS — E785 Hyperlipidemia, unspecified: Secondary | ICD-10-CM | POA: Diagnosis not present

## 2017-01-28 DIAGNOSIS — F419 Anxiety disorder, unspecified: Secondary | ICD-10-CM | POA: Diagnosis not present

## 2017-01-28 MED FILL — PANTOPRAZOLE SOD DR 40 MG T: 40 | 90 days supply | Qty: 90 | Fill #0

## 2017-01-28 MED FILL — HYDROCHLOROTHIAZIDE 12.5 MG: 12.5 | 90 days supply | Qty: 90 | Fill #0

## 2017-01-31 MED FILL — LOSARTAN POTASSIUM 50 MG TA: 50 | 90 days supply | Qty: 90 | Fill #0

## 2017-02-07 DIAGNOSIS — Z6823 Body mass index (BMI) 23.0-23.9, adult: Secondary | ICD-10-CM | POA: Diagnosis not present

## 2017-02-07 DIAGNOSIS — Z01419 Encounter for gynecological examination (general) (routine) without abnormal findings: Secondary | ICD-10-CM | POA: Diagnosis not present

## 2017-02-07 MED FILL — YUVAFEM 10 MCG TABS: 10 | 60 days supply | Qty: 18 | Fill #0

## 2017-02-10 ENCOUNTER — Telehealth: Payer: 59 | Admitting: Family

## 2017-02-10 DIAGNOSIS — B9789 Other viral agents as the cause of diseases classified elsewhere: Secondary | ICD-10-CM | POA: Diagnosis not present

## 2017-02-10 DIAGNOSIS — J329 Chronic sinusitis, unspecified: Secondary | ICD-10-CM | POA: Diagnosis not present

## 2017-02-10 MED ORDER — FLUTICASONE PROPIONATE 50 MCG/ACT NA SUSP: 2.0000 | Freq: Every day | NASAL | 6 refills | Status: AC

## 2017-02-10 NOTE — Progress Notes (Signed)
We are sorry that you are not feeling well.  Here is how we plan to help!  Based on what you have shared with me it looks like you have sinusitis.  Sinusitis is inflammation and infection in the sinus cavities of the head.  Based on your presentation I believe you most likely have Acute Viral Sinusitis.This is an infection most likely caused by a virus. There is not specific treatment for viral sinusitis other than to help you with the symptoms until the infection runs its course.  You may use an oral decongestant such as Mucinex D or if you have glaucoma or high blood pressure use plain Mucinex. Saline nasal spray help and can safely be used as often as needed for congestion, I have prescribed: Fluticasone nasal spray two sprays in each nostril twice a day  Providers prescribe antibiotics to treat infections caused by bacteria. Antibiotics are very powerful in treating bacterial infections when they are used properly. To maintain their effectiveness, they should be used only when necessary. Overuse of antibiotics has resulted in the development of superbugs that are resistant to treatment!    After careful review of your answers, I would not recommend an antibiotic for your condition.  Antibiotics are not effective against viruses and therefore should not be used to treat them. Common examples of infections caused by viruses include colds and flu    Some authorities believe that zinc sprays or the use of Echinacea may shorten the course of your symptoms.  Sinus infections are not as easily transmitted as other respiratory infection, however we still recommend that you avoid close contact with loved ones, especially the very young and elderly.  Remember to wash your hands thoroughly throughout the day as this is the number one way to prevent the spread of infection!  Home Care:  Only take medications as instructed by your medical team.  Complete the entire course of an antibiotic.  Do not take  these medications with alcohol.  A steam or ultrasonic humidifier can help congestion.  You can place a towel over your head and breathe in the steam from hot water coming from a faucet.  Avoid close contacts especially the very young and the elderly.  Cover your mouth when you cough or sneeze.  Always remember to wash your hands.  Get Help Right Away If:  You develop worsening fever or sinus pain.  You develop a severe head ache or visual changes.  Your symptoms persist after you have completed your treatment plan.  Make sure you  Understand these instructions.  Will watch your condition.  Will get help right away if you are not doing well or get worse.  Your e-visit answers were reviewed by a board certified advanced clinical practitioner to complete your personal care plan.  Depending on the condition, your plan could have included both over the counter or prescription medications.  If there is a problem please reply  once you have received a response from your provider.  Your safety is important to [place].  If you have drug allergies check your prescription carefully.    You can use MyChart to ask questions about today's visit, request a non-urgent call back, or ask for a work or school excuse for 24 hours related to this e-Visit. If it has been greater than 24 hours you will need to follow up with your provider, or enter a new e-Visit to address those concerns.  You will get an e-mail in the next two  days asking about your experience.  I hope that your e-visit has been valuable and will speed your recovery. Thank you for using e-visits.

## 2017-02-11 MED FILL — PRAVASTATIN NA 40 MG TAB: 40 | 90 days supply | Qty: 90 | Fill #2

## 2017-02-11 MED FILL — TOPIRAMATE 25 MG TAB: 25 | 30 days supply | Qty: 30 | Fill #5

## 2017-02-27 MED FILL — busPIRone HCL 5 MG TABS: 5 | 30 days supply | Qty: 90 | Fill #2

## 2017-03-01 DIAGNOSIS — H52203 Unspecified astigmatism, bilateral: Secondary | ICD-10-CM | POA: Diagnosis not present

## 2017-03-07 ENCOUNTER — Other Ambulatory Visit: Payer: Self-pay | Admitting: Gastroenterology

## 2017-03-07 DIAGNOSIS — K219 Gastro-esophageal reflux disease without esophagitis: Secondary | ICD-10-CM | POA: Diagnosis not present

## 2017-03-07 DIAGNOSIS — Z1211 Encounter for screening for malignant neoplasm of colon: Secondary | ICD-10-CM | POA: Diagnosis not present

## 2017-03-07 DIAGNOSIS — K5904 Chronic idiopathic constipation: Secondary | ICD-10-CM | POA: Diagnosis not present

## 2017-03-07 MED FILL — MECLIZINE 25 MG TABLET: 25 | 10 days supply | Qty: 10 | Fill #0

## 2017-03-08 NOTE — Progress Notes (Unsigned)
History of Present Illness  General:  61/female was referred for GERD and being on PPI for over 10 years. She had a colonoscopy in 12/17 showed unsatisfactory prep but no polyps/tumors and was advised to get a cologuard testing. Colonoscopy in 2011 showed fair prep, normal otherwise. CT from 2017 showed moderate to large amount of feces throughout the colon, no bowel obstruction. USG from 2015 no acute hepatobiliary abnormality. CBC from 01/24/17 showed Hb 13, MCV 85, Plt 249.CMP was unremarkable. She reports that when she was younger she had IBS diarrhea and as she got older she has more constipation and over Thanksgiving she had RLQ pain and back pain and had to take hyoscamine and it caused reflief of pain but some diarrhea. She has been under a lot of stress as her husband was recently diagnosed with lung cancer. On an average she may have no BM for 2-3 weeks, she takes stool softners, fiber pillls, miralax, herbal tea, feels like it is a constant job to take to have a BM. Consistency of stool is small and then leaks out?overflow incontinence. She has had accidents in the past. Denies nausea or vomiting, she has early satiety and abdominal distension/swollen which mproves with BMs. Denies blood in stool or black stools but stool have been darker, she has some hemorrhoidal tyep bleeding which is BRB on toilet paper and has tenesmus and feels incomplete evacuation. SHe has not had a cologuard testing. She has had acid reflux for years, denies heartrbun infrequently, mostly related to certain foods like spicy food. Denies pain on swallowing or trouble swallowing. has been stressed and reports 10 lbs of unintentional weight loss in 1 year. She has a good appetite. She eats fruits and vegetables and reports drinking water about 16 oz. She drinks coffee 2 cups of regular and 2 cups decaf, but denies use of tea. She reports that her last meal of the day is around 7:30 pm and bedtime is around  9-10pm.   Current Medications  Taking   Topamax(Topiramate) 25 MG Tablet 1 tablet Orally once a day   Meclizine HCl 25 mg Tablet 1 tablet as needed Orally Once a day   Levocetirizine Dihydrochloride 5 MG Tablet TAKE 1 TABLET BY MOUTH ONCE DAILY as needed   Fluconazole 150 mg one dose 150 mg tablet 1 tablet orally once   HyoMax-SR(Hyoscyamine Sulfate ER) 0.375 MG Tablet Extended Release 12 Hour 1 tablet Orally q am prior to breakfast   Ondansetron HCl 4 MG Tablet 1 tablet Orally twice a day as needed   Terazol 3(Terconazole) 0.8 % Cream 1 application at bedtime Vaginal Once a day   Advil(Ibuprofen) 200 MG Tablet 1 tablet with food or milk as needed Orally twice daily for two weeks   Fluticasone Propionate 50 MCG/ACT Suspension 2spray in each nostril Nasally Once a day   Venlafaxine HCl 100 MG Tablet take 1 tablet by mouth twice daily with food Orally Twice a day   Pravachol(Pravastatin Sodium) 40 MG Tablet 1 tablet Orally Once a day   BusPIRone HCl 5 MG Tablet 1 tablet Orally Three times a day   Clotrimazole-Betamethasone 2-9.51 % Cream 1 application to affected area Externally Twice a day   Tizanidine HCl 4 MG Tablet 1 tablet as needed Orally Three times a day   Excedrin Migraine(Aspirin-Acetaminophen-Caffeine) 250-250-65 MG Tablet 2 tablets Orally Once a day   Metoprolol-12.5 mg 25 mg Tablet one half tab orally daily   Losartan Potassium 50 MG Tablet 1 tablet Orally Once  a day   HCTZ 12.5 MG Tablet 1 tablet Orally Once a day   Pantoprazole Sodium 40 MG Tablet Delayed Release 1 tablet Orally Once a day   Not-Taking   Clonazepam 0.5 MG Tablet 1/2 tablet oral once daily   Medication List reviewed and reconciled with the patient    Past Medical History  IBS.   Migraine headaches.   Back pain.   Allergic rhinitis.   Osteoarthritis.   Sinusitis.   GYN [facility], anual paps, mammograms.   [facility]   Colonoscopy 12/2009, normal, repeat 10 years, dr Wynetta Emery.    Depression.   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   Chest pain, LHC.    Surgical History  bunionectomy   tubal ligation   breast biopsy - left   cataracts   Anal carcinoma excision 12/2014   Family History  Father: alive, Alzheimer parkinsons, diagnosed with Hypertension  Mother: alive, Breast Cancer dx in early 3, diagnosed with Diabetes, Hypertension  Maternal aunt: unknown, Breast Cancer  no colon cancer no early CAD.   Social History  General:  Tobacco use  cigarettes: Never smoked Additional Findings: Tobacco Non-User Non-smoker for personal reasons Tobacco history last updated 01/28/2017 no EXPOSURE TO PASSIVE SMOKE.  no Alcohol.  Caffeine: yes, 2+ servings daily, coffee.  no Recreational drug use.  Exercise: walks 3-4 times a week, 30 minutes.  Marital Status: married.  Children: 2.  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   Allergies  Erythromycin: Abd. pain & Diarrhea   Hospitalization/Major Diagnostic Procedure  Chest pain, ED visit, ruled out MI, [facility] 10/21/2015 10/15/2015  Not in the past year 03/2017   Review of Systems  CONSTITUTIONAL:  Chills No. Fatigue No. Fever No. Insomnia No. Night sweats No. Weight change No.  HEENT:  Change in vision No. Double vision No . Hoarseness No. Nose bleeds No. sore throat No. Sinus Problems No. Glaucoma No.  CARDIOLOGY:  ByPass Surgery No. Poor Circulation No. Artificial Heart Valves No. High blood pressure YES. History of Heart attack No. Irregular heart beat No. Known coronary artery disease No. Pacemaker/Defibrillator No.  RESPIRATORY:  Shortness of breath No. Cough No. Excessive Sputum No. Using Oxygen No. Asthma No. Bronchitis No. Pneumonia No. Sleep Apnea No.  UROLOGY:  Interstitials Cystitis YES. Incontinence No. Blood in urine No. Difficulty urinating No. Kidney disease No. Kidney stones No.   GASTROENTEROLOGY:  Abdominal pain YES. Acid reflux YES. Black stools No. Bloating/belching YES. Change in bowel habits No. Constipation YES. Dark tarry stools No. Diarrhea YES. Difficulty swallowing No. Heartburn YES. Incontinence of stool No. Indigestion: No. Lactose intolerance No. Nausea No. Rectal bleeding No. Vomiting No. Hepatitis/yellow jaundice No. History of Ulcers No. Use of Pain Medication No. Previous Colonoscopy YES.  MUSCULOSKELETAL:  joint pain YES. Arthritis No. Joint Replacement No.  NEUROLOGY:  Dizziness YES. Fainting No. Headache YES. Paralysis No. Seizures No. Strokes No. Weakness No. Alzheimer's No.  SKIN:  Rash No. Bruises easily No.  ENDOCRINOLOGY:  Diabetes No. High cholesterol YES. Thyroid disorder No.  HEMATOLOGY/LYMPH:  Abnormal bleeding No. Anemia No. Enlarged lymph nodes No. Past blood transfusion No. Swollen glands No. Blood Clots No. Using Blood Thinners No.  INFECTIOUS DISEASE:  HIV/AIDS No. Tuberculosis No . Hepatitis No. Sexually Transmitted Disease No. MRSA No.  GI PROCEDURE:  no Pacemaker/ AICD, no. no Artificial heart valves. no MI/heart attack. no Abnormal heart rhythm.  no Angina. no CVA. Hypertension YES, YES. no Hypotension. no Asthma, COPD. no Sleep apnea. no Seizure disorders. no Artificial joints. no Severe DJD. no Diabetes. Significant headaches YES, YES. Vertigo YES, YES. Depression/anxiety YES, YES. no Abnormal bleeding. no Kidney Disease. no Liver disease, no. no Chance of pregnancy. no Blood transfusion. no Method of Birth Control. no Birth control pills.  GU/GYN:  Pregnant Now No. Trying to conceive No. Use Birth Control No. Heavy Periods No.      Vital Signs  Wt 127.5, Wt change -.5 lb, Ht 62.5, BMI 22.95, Temp 98.0, Pulse sitting 68, BP sitting 120/75.   Examination  Gastroenterology:: GENERAL APPEARANCE: Well developed, well nourished, no active distress, pleasant, no acute distress .  EYES: Lids and conjunctiva normal. Sclera normal,  pupils equal and reactive .  ORAL CAVITY: Lips, teeth and gums are normal. Pharynx, tongue, mucosa normal .  SCLERA: anicteric .  NECK Full ROM, trachea midline, no thyromegaly or masses .  CARDIOVASCULAR PMI LS border. Normal RRR w/o murmers or gallops. No peripheral edema .  RESPIRATORY Breath sounds normal. Respiration even and unlabored .  ABDOMEN No masses palpated. Liver and spleen not palpated, normal. Bowel sounds normal, Abdomen not distended .  EXTREMITIES: No edema, pulses intact .  NEURO: normal strength and reflexes, cranial nerves II-XII grossly intact, normal gait .  PSYCH: mood/affect normal .     Assessments   1. Chronic gastroesophageal reflux disease - K21.9 (Primary)   2. Chronic idiopathic constipation - K59.04   3. Screen for colon cancer - Z12.11   Treatment  1. Chronic gastroesophageal reflux disease  IMAGING: Esophagoscopy    Whitfield,Dia 03/07/2017 09:04:16 AM > called and spoke with Kendall-scheduled for 04/09/17-WL-prep instructions given for 2 day prep.   Notes: Patient has had acid reflux for over 5 years and is about 61 years of age will perform a diagnostic endoscopy.I discussed about risk of Barrett's esophagus with uncontrolled acid reflux. I also discussed side effects of long-term PPI use. Recommend lifestyle modification to decrease acid reflux such as avoid or limit caffeinated products, to take last meal of the day 3 hours before bedtime, elevated head end of the bed during sleep, avoid or limit alcohol and weight loss. No Alarm features such as dysphagia or melena noted.  Referral To: Reason:endo-colon-propofol-WL-spoke with [phone]    2. Chronic idiopathic constipation  Start Linzess Capsule, 290 MCG, 1 capsule, Orally, Once a day, 90 days, 90 Capsule, Refills 3 Notes: Will start patient on lenses to 290 MCG. Patient was provided sample for 8 days. Discussed with the patient that she needs to increase fiber in her diet and also  increased water intake to at least 60 oz a day.    3. Screen for colon cancer  IMAGING: Colonoscopy    Whitfield,Dia 03/07/2017 09:04:16 AM > called and spoke with Kendall-scheduled for 04/09/17-WL-prep instructions given for 2 day prep.   Notes: Patients colonoscopy from 2011 and 2017 showed poor prep. Patient has not had a cologuard testing. I discussed about options available including risk and benefits of colonoscopy versus cologuard testing. Patient wants to proceed with a colonoscopy. She will be advised to stay on clear liquid diet for 2 days before the procedure. Patient will be given prescription to take magnesium citrate 300 ML in a.m. 300 ML in p.m. 2 days before the procedure. The day before the procedure she will be instructed to take 2 pills of Dulcolax at noon followed by 2 pills of Dulcolax at  2 PM. She will then be given prescription for trilyte, to take 3/4th of the amount the evening before the procedure, 1/4th of the amount the morning of procedure.    4. Others  Referral To: Reason:endo-colon-propofol-[facility]-spoke with [phone]    Follow Up  2 weeks after EGD/Colon

## 2017-03-12 MED FILL — LINZESS 290 MCG CAPSULE: 290 | 90 days supply | Qty: 90 | Fill #0

## 2017-03-19 MED FILL — TOPIRAMATE 25 MG TAB: 25 | 30 days supply | Qty: 30 | Fill #6

## 2017-04-03 MED FILL — PEG-3350 SOLUTION: 420 | 1 days supply | Qty: 4000 | Fill #0

## 2017-04-04 ENCOUNTER — Encounter (HOSPITAL_COMMUNITY): Payer: Self-pay | Admitting: Emergency Medicine

## 2017-04-04 ENCOUNTER — Other Ambulatory Visit: Payer: Self-pay

## 2017-04-08 NOTE — H&P (Signed)
History of Present Illness  General:  62/female was referred for GERD and being on PPI for over 10 years. She had a colonoscopy in 12/17 showed unsatisfactory prep but no polyps/tumors and was advised to get a cologuard testing. Colonoscopy in 2011 showed fair prep, normal otherwise. CT from 2017 showed moderate to large amount of feces throughout the colon, no bowel obstruction. USG from 2015 no acute hepatobiliary abnormality. CBC from 01/24/17 showed Hb 13, MCV 85, Plt 249.CMP was unremarkable. She reports that when she was younger she had IBS diarrhea and as she got older she has more constipation and over Thanksgiving she had RLQ pain and back pain and had to take hyoscamine and it caused reflief of pain but some diarrhea. She has been under a lot of stress as her husband was recently diagnosed with lung cancer. On an average she may have no BM for 2-3 weeks, she takes stool softners, fiber pillls, miralax, herbal tea, feels like it is a constant job to take to have a BM. Consistency of stool is small and then leaks out?overflow incontinence. She has had accidents in the past. Denies nausea or vomiting, she has early satiety and abdominal distension/swollen which mproves with BMs. Denies blood in stool or black stools but stool have been darker, she has some hemorrhoidal tyep bleeding which is BRB on toilet paper and has tenesmus and feels incomplete evacuation. SHe has not had a cologuard testing. She has had acid reflux for years, denies heartrbun infrequently, mostly related to certain foods like spicy food. Denies pain on swallowing or trouble swallowing. has been stressed and reports 10 lbs of unintentional weight loss in 1 year. She has a good appetite. She eats fruits and vegetables and reports drinking water about 16 oz. She drinks coffee 2 cups of regular and 2 cups decaf, but denies use of tea. She reports that her last meal of the day is around 7:30 pm and bedtime is around  9-10pm.   Current Medications  Taking   Topamax(Topiramate) 25 MG Tablet 1 tablet Orally once a day   Meclizine HCl 25 mg Tablet 1 tablet as needed Orally Once a day   Levocetirizine Dihydrochloride 5 MG Tablet TAKE 1 TABLET BY MOUTH ONCE DAILY as needed   Fluconazole 150 mg one dose 150 mg tablet 1 tablet orally once   HyoMax-SR(Hyoscyamine Sulfate ER) 0.375 MG Tablet Extended Release 12 Hour 1 tablet Orally q am prior to breakfast   Ondansetron HCl 4 MG Tablet 1 tablet Orally twice a day as needed   Terazol 3(Terconazole) 0.8 % Cream 1 application at bedtime Vaginal Once a day   Advil(Ibuprofen) 200 MG Tablet 1 tablet with food or milk as needed Orally twice daily for two weeks   Fluticasone Propionate 50 MCG/ACT Suspension 2spray in each nostril Nasally Once a day   Venlafaxine HCl 100 MG Tablet take 1 tablet by mouth twice daily with food Orally Twice a day   Pravachol(Pravastatin Sodium) 40 MG Tablet 1 tablet Orally Once a day   BusPIRone HCl 5 MG Tablet 1 tablet Orally Three times a day   Clotrimazole-Betamethasone 5-1.76 % Cream 1 application to affected area Externally Twice a day   Tizanidine HCl 4 MG Tablet 1 tablet as needed Orally Three times a day   Excedrin Migraine(Aspirin-Acetaminophen-Caffeine) 250-250-65 MG Tablet 2 tablets Orally Once a day   Metoprolol-12.5 mg 25 mg Tablet one half tab orally daily   Losartan Potassium 50 MG Tablet 1 tablet Orally Once  a day   HCTZ 12.5 MG Tablet 1 tablet Orally Once a day   Pantoprazole Sodium 40 MG Tablet Delayed Release 1 tablet Orally Once a day   Not-Taking   Clonazepam 0.5 MG Tablet 1/2 tablet oral once daily   Medication List reviewed and reconciled with the patient    Past Medical History  IBS.   Migraine headaches.   Back pain.   Allergic rhinitis.   Osteoarthritis.   Sinusitis.   GYN [facility], anual paps, mammograms.   [facility].   Colonoscopy 12/2009, normal, repeat 10 years, dr Wynetta Emery.    Depression.   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   Chest pain, LHC.    Surgical History  bunionectomy   tubal ligation   breast biopsy - left   cataracts   Anal carcinoma excision 12/2014   Family History  Father: alive, Alzheimer parkinsons, diagnosed with Hypertension  Mother: alive, Breast Cancer dx in early 67, diagnosed with Diabetes, Hypertension  Maternal aunt: unknown, Breast Cancer  no colon cancer no early CAD.   Social History  General:  Tobacco use  cigarettes: Never smoked Additional Findings: Tobacco Non-User Non-smoker for personal reasons Tobacco history last updated 01/28/2017 no EXPOSURE TO PASSIVE SMOKE.  no Alcohol.  Caffeine: yes, 2+ servings daily, coffee.  no Recreational drug use.  Exercise: walks 3-4 times a week, 30 minutes.  Marital Status: married.  Children: 2.  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   Allergies  Erythromycin: Abd. pain & Diarrhea   Hospitalization/Major Diagnostic Procedure  Chest pain, ED visit, ruled out MI, [facility] 10/21/2015 10/15/2015  Not in the past year 03/2017   Review of Systems  CONSTITUTIONAL:  Chills No. Fatigue No. Fever No. Insomnia No. Night sweats No. Weight change No.  HEENT:  Change in vision No. Double vision No . Hoarseness No. Nose bleeds No. sore throat No. Sinus Problems No. Glaucoma No.  CARDIOLOGY:  ByPass Surgery No. Poor Circulation No. Artificial Heart Valves No. High blood pressure YES. History of Heart attack No. Irregular heart beat No. Known coronary artery disease No. Pacemaker/Defibrillator No.  RESPIRATORY:  Shortness of breath No. Cough No. Excessive Sputum No. Using Oxygen No. Asthma No. Bronchitis No. Pneumonia No. Sleep Apnea No.  UROLOGY:  Interstitials Cystitis YES. Incontinence No. Blood in urine No. Difficulty urinating No. Kidney disease No. Kidney stones No.   GASTROENTEROLOGY:  Abdominal pain YES. Acid reflux YES. Black stools No. Bloating/belching YES. Change in bowel habits No. Constipation YES. Dark tarry stools No. Diarrhea YES. Difficulty swallowing No. Heartburn YES. Incontinence of stool No. Indigestion: No. Lactose intolerance No. Nausea No. Rectal bleeding No. Vomiting No. Hepatitis/yellow jaundice No. History of Ulcers No. Use of Pain Medication No. Previous Colonoscopy YES.  MUSCULOSKELETAL:  joint pain YES. Arthritis No. Joint Replacement No.  NEUROLOGY:  Dizziness YES. Fainting No. Headache YES. Paralysis No. Seizures No. Strokes No. Weakness No. Alzheimer's No.  SKIN:  Rash No. Bruises easily No.  ENDOCRINOLOGY:  Diabetes No. High cholesterol YES. Thyroid disorder No.  HEMATOLOGY/LYMPH:  Abnormal bleeding No. Anemia No. Enlarged lymph nodes No. Past blood transfusion No. Swollen glands No. Blood Clots No. Using Blood Thinners No.  INFECTIOUS DISEASE:  HIV/AIDS No. Tuberculosis No . Hepatitis No. Sexually Transmitted Disease No. MRSA No.  GI PROCEDURE:  no Pacemaker/ AICD, no. no Artificial heart valves. no MI/heart attack. no Abnormal heart rhythm.  no Angina. no CVA. Hypertension YES, YES. no Hypotension. no Asthma, COPD. no Sleep apnea. no Seizure disorders. no Artificial joints. no Severe DJD. no Diabetes. Significant headaches YES, YES. Vertigo YES, YES. Depression/anxiety YES, YES. no Abnormal bleeding. no Kidney Disease. no Liver disease, no. no Chance of pregnancy. no Blood transfusion. no Method of Birth Control. no Birth control pills.  GU/GYN:  Pregnant Now No. Trying to conceive No. Use Birth Control No. Heavy Periods No.      Vital Signs  Wt 127.5, Wt change -.5 lb, Ht 62.5, BMI 22.95, Temp 98.0, Pulse sitting 68, BP sitting 120/75.   Examination  Gastroenterology:: GENERAL APPEARANCE: Well developed, well nourished, no active distress, pleasant, no acute distress .  EYES: Lids and conjunctiva normal. Sclera normal,  pupils equal and reactive .  ORAL CAVITY: Lips, teeth and gums are normal. Pharynx, tongue, mucosa normal .  SCLERA: anicteric .  NECK Full ROM, trachea midline, no thyromegaly or masses .  CARDIOVASCULAR PMI LS border. Normal RRR w/o murmers or gallops. No peripheral edema .  RESPIRATORY Breath sounds normal. Respiration even and unlabored .  ABDOMEN No masses palpated. Liver and spleen not palpated, normal. Bowel sounds normal, Abdomen not distended .  EXTREMITIES: No edema, pulses intact .  NEURO: normal strength and reflexes, cranial nerves II-XII grossly intact, normal gait .  PSYCH: mood/affect normal .     Assessments   1. Chronic gastroesophageal reflux disease - K21.9 (Primary)   2. Chronic idiopathic constipation - K59.04   3. Screen for colon cancer - Z12.11   Treatment  1. Chronic gastroesophageal reflux disease  IMAGING: Esophagoscopy    Whitfield,Dia 03/07/2017 09:04:16 AM > called and spoke with Kendall-scheduled for 04/09/17-WL-prep instructions given for 2 day prep.   Notes: Patient has had acid reflux for over 5 years and is about 62 years of age will perform a diagnostic endoscopy.I discussed about risk of Barrett's esophagus with uncontrolled acid reflux. I also discussed side effects of long-term PPI use. Recommend lifestyle modification to decrease acid reflux such as avoid or limit caffeinated products, to take last meal of the day 3 hours before bedtime, elevated head end of the bed during sleep, avoid or limit alcohol and weight loss. No Alarm features such as dysphagia or melena noted.  Referral To: Reason:endo-colon-propofol-[facility]-spoke with [phone]    2. Chronic idiopathic constipation  Start Linzess Capsule, 290 MCG, 1 capsule, Orally, Once a day, 90 days, 90 Capsule, Refills 3 Notes: Will start patient on lenses to 290 MCG. Patient was provided sample for 8 days. Discussed with the patient that she needs to increase fiber in her diet and also  increased water intake to at least 60 oz a day.    3. Screen for colon cancer  IMAGING: Colonoscopy    Whitfield,Dia 03/07/2017 09:04:16 AM > called and spoke with Kendall-scheduled for 04/09/17-WL-prep instructions given for 2 day prep.   Notes: Patients colonoscopy from 2011 and 2017 showed poor prep. Patient has not had a cologuard testing. I discussed about options available including risk and benefits of colonoscopy versus cologuard testing. Patient wants to proceed with a colonoscopy. She will be advised to stay on clear liquid diet for 2 days before the procedure. Patient will be given prescription to take magnesium citrate 300 ML in a.m. 300 ML in p.m. 2 days before the procedure. The day before the procedure she will be instructed to take 2 pills of Dulcolax at noon followed by 2 pills of Dulcolax at  2 PM. She will then be given prescription for trilyte, to take 3/4th of the amount the evening before the procedure, 1/4th of the amount the morning of procedure.       Ronnette Juniper, MD

## 2017-04-09 ENCOUNTER — Encounter (HOSPITAL_COMMUNITY): Payer: Self-pay | Admitting: *Deleted

## 2017-04-09 ENCOUNTER — Ambulatory Visit (HOSPITAL_COMMUNITY): Payer: 59 | Admitting: Certified Registered Nurse Anesthetist

## 2017-04-09 ENCOUNTER — Other Ambulatory Visit: Payer: Self-pay

## 2017-04-09 ENCOUNTER — Ambulatory Visit (HOSPITAL_COMMUNITY)
Admission: RE | Admit: 2017-04-09 | Discharge: 2017-04-09 | Disposition: A | Discharge status: Home / Self Care | Payer: 59 | Source: Ambulatory Visit | Attending: Gastroenterology | Admitting: Gastroenterology

## 2017-04-09 ENCOUNTER — Encounter (HOSPITAL_COMMUNITY)
Admission: RE | Disposition: A | Discharge status: Home / Self Care | Payer: Self-pay | Source: Ambulatory Visit | Attending: Gastroenterology

## 2017-04-09 DIAGNOSIS — K2970 Gastritis, unspecified, without bleeding: Secondary | ICD-10-CM | POA: Diagnosis not present

## 2017-04-09 DIAGNOSIS — K219 Gastro-esophageal reflux disease without esophagitis: Secondary | ICD-10-CM | POA: Diagnosis not present

## 2017-04-09 DIAGNOSIS — F329 Major depressive disorder, single episode, unspecified: Secondary | ICD-10-CM | POA: Insufficient documentation

## 2017-04-09 DIAGNOSIS — Z79899 Other long term (current) drug therapy: Secondary | ICD-10-CM | POA: Diagnosis not present

## 2017-04-09 DIAGNOSIS — Z1211 Encounter for screening for malignant neoplasm of colon: Secondary | ICD-10-CM | POA: Diagnosis not present

## 2017-04-09 DIAGNOSIS — K3189 Other diseases of stomach and duodenum: Secondary | ICD-10-CM | POA: Diagnosis not present

## 2017-04-09 DIAGNOSIS — I10 Essential (primary) hypertension: Secondary | ICD-10-CM | POA: Diagnosis not present

## 2017-04-09 DIAGNOSIS — Z7982 Long term (current) use of aspirin: Secondary | ICD-10-CM | POA: Insufficient documentation

## 2017-04-09 DIAGNOSIS — K580 Irritable bowel syndrome with diarrhea: Secondary | ICD-10-CM | POA: Diagnosis not present

## 2017-04-09 HISTORY — PX: COLONOSCOPY: SHX5424

## 2017-04-09 HISTORY — PX: ESOPHAGOGASTRODUODENOSCOPY: SHX5428

## 2017-04-09 SURGERY — COLONOSCOPY
Anesthesia: Monitor Anesthesia Care

## 2017-04-09 MED ORDER — PROPOFOL 10 MG/ML IV BOLUS
INTRAVENOUS | Status: AC
  Filled 2017-04-09: qty 40

## 2017-04-09 MED ORDER — PROPOFOL 10 MG/ML IV BOLUS
INTRAVENOUS | Status: AC
  Filled 2017-04-09: qty 20

## 2017-04-09 MED ORDER — ONDANSETRON HCL 4 MG/2ML IJ SOLN
INTRAMUSCULAR | Status: DC | PRN
  Administered 2017-04-09: 4 mg via INTRAVENOUS

## 2017-04-09 MED ORDER — PROPOFOL 10 MG/ML IV BOLUS
INTRAVENOUS | Status: DC | PRN
  Administered 2017-04-09: 20 mg via INTRAVENOUS

## 2017-04-09 MED ORDER — SODIUM CHLORIDE 0.9 % IV SOLN: INTRAVENOUS | Status: DC

## 2017-04-09 MED ORDER — LACTATED RINGERS IV SOLN
INTRAVENOUS | Status: DC
  Administered 2017-04-09: 1000 mL via INTRAVENOUS

## 2017-04-09 MED ORDER — PROPOFOL 500 MG/50ML IV EMUL
INTRAVENOUS | Status: DC | PRN
  Administered 2017-04-09: 150 ug/kg/min via INTRAVENOUS

## 2017-04-09 MED ORDER — LIDOCAINE 2% (20 MG/ML) 5 ML SYRINGE
INTRAMUSCULAR | Status: DC | PRN
  Administered 2017-04-09: 60 mg via INTRAVENOUS

## 2017-04-09 NOTE — Discharge Instructions (Signed)

## 2017-04-09 NOTE — Anesthesia Postprocedure Evaluation (Signed)
Anesthesia Post Note  Patient: April Rodriguez  Procedure(s) Performed: COLONOSCOPY (N/A ) ESOPHAGOGASTRODUODENOSCOPY (EGD) (N/A )     Patient location during evaluation: Endoscopy Anesthesia Type: MAC Level of consciousness: awake and alert Pain management: pain level controlled Vital Signs Assessment: post-procedure vital signs reviewed and stable Respiratory status: spontaneous breathing, nonlabored ventilation, respiratory function stable and patient connected to nasal cannula oxygen Cardiovascular status: stable and blood pressure returned to baseline Postop Assessment: no apparent nausea or vomiting Anesthetic complications: no    Last Vitals:  Vitals:   04/09/17 1245 04/09/17 1250  BP:  (!) 142/73  Pulse:  72  Resp:  18  Temp:    SpO2: 100% 100%    Last Pain:  Vitals:   04/09/17 1229  TempSrc: Oral                 Stephen A Houser     

## 2017-04-09 NOTE — Anesthesia Postprocedure Evaluation (Signed)
Anesthesia Post Note  Patient: April Rodriguez  Procedure(s) Performed: COLONOSCOPY (N/A ) ESOPHAGOGASTRODUODENOSCOPY (EGD) (N/A )     Patient location during evaluation: Endoscopy Anesthesia Type: MAC Level of consciousness: awake and alert Pain management: pain level controlled Vital Signs Assessment: post-procedure vital signs reviewed and stable Respiratory status: spontaneous breathing, nonlabored ventilation, respiratory function stable and patient connected to nasal cannula oxygen Cardiovascular status: stable and blood pressure returned to baseline Postop Assessment: no apparent nausea or vomiting Anesthetic complications: no    Last Vitals:  Vitals:   04/09/17 1245 04/09/17 1250  BP:  (!) 142/73  Pulse:  72  Resp:  18  Temp:    SpO2: 100% 100%    Last Pain:  Vitals:   04/09/17 1229  TempSrc: Oral                 Barnet Glasgow

## 2017-04-09 NOTE — Anesthesia Preprocedure Evaluation (Signed)
Anesthesia Evaluation  Patient identified by MRN, date of birth, ID band Patient awake    Reviewed: Allergy & Precautions, NPO status , Patient's Chart, lab work & pertinent test results  Airway Mallampati: II  TM Distance: >3 FB Neck ROM: Full    Dental no notable dental hx.    Pulmonary neg pulmonary ROS,    Pulmonary exam normal breath sounds clear to auscultation       Cardiovascular hypertension, negative cardio ROS Normal cardiovascular exam Rhythm:Regular Rate:Normal     Neuro/Psych  Headaches, negative neurological ROS  negative psych ROS   GI/Hepatic negative GI ROS, Neg liver ROS, hiatal hernia, GERD  ,  Endo/Other  negative endocrine ROS  Renal/GU negative Renal ROS  negative genitourinary   Musculoskeletal negative musculoskeletal ROS (+)   Abdominal   Peds negative pediatric ROS (+)  Hematology negative hematology ROS (+)   Anesthesia Other Findings   Reproductive/Obstetrics negative OB ROS                             Anesthesia Physical Anesthesia Plan  ASA: II  Anesthesia Plan: MAC   Post-op Pain Management:    Induction: Intravenous  PONV Risk Score and Plan: 1 and Treatment may vary due to age or medical condition  Airway Management Planned: Mask, Natural Airway and Nasal Cannula  Additional Equipment:   Intra-op Plan:   Post-operative Plan: Extubation in OR  Informed Consent: I have reviewed the patients History and Physical, chart, labs and discussed the procedure including the risks, benefits and alternatives for the proposed anesthesia with the patient or authorized representative who has indicated his/her understanding and acceptance.   Dental advisory given  Plan Discussed with: CRNA  Anesthesia Plan Comments:         Anesthesia Quick Evaluation

## 2017-04-09 NOTE — Anesthesia Procedure Notes (Signed)
Procedure Name: MAC Date/Time: 04/09/2017 11:49 AM Performed by: West Pugh, CRNA Pre-anesthesia Checklist: Patient identified, Emergency Drugs available, Suction available, Patient being monitored and Timeout performed Patient Re-evaluated:Patient Re-evaluated prior to induction Oxygen Delivery Method: Nasal cannula Placement Confirmation: positive ETCO2,  CO2 detector and breath sounds checked- equal and bilateral Dental Injury: Teeth and Oropharynx as per pre-operative assessment

## 2017-04-09 NOTE — Transfer of Care (Signed)
Immediate Anesthesia Transfer of Care Note  Patient: April Rodriguez  Procedure(s) Performed: COLONOSCOPY (N/A ) ESOPHAGOGASTRODUODENOSCOPY (EGD) (N/A )  Patient Location: PACU  Anesthesia Type:MAC  Level of Consciousness: awake, drowsy and responds to stimulation  Airway & Oxygen Therapy: Patient Spontanous Breathing and Patient connected to nasal cannula oxygen  Post-op Assessment: Report given to RN, Post -op Vital signs reviewed and stable and Patient moving all extremities X 4  Post vital signs: Reviewed and stable  Last Vitals:  Vitals:   04/09/17 1034  BP: 134/71  Pulse: 69  Resp: 16  Temp: 36.6 C  SpO2: 100%    Last Pain:  Vitals:   04/09/17 1034  TempSrc: Oral         Complications: No apparent anesthesia complications

## 2017-04-09 NOTE — Interval H&P Note (Signed)
History and Physical Interval Note: 61/female for EGD for chronic GERD and colonoscopy for screening for colon cancer. Last colonoscopy from 2011- poor prep. Has taken 2 days of prep and clear liquid diet. 04/09/2017 11:44 AM  April Rodriguez  has presented today for EGD and colonsocopy, with the diagnosis of Screen for colon cancer, Chronic gastroesophageal reflux disease  The various methods of treatment have been discussed with the patient and family. After consideration of risks, benefits and other options for treatment, the patient has consented to  Procedure(s): COLONOSCOPY (N/A) ESOPHAGOGASTRODUODENOSCOPY (EGD) (N/A) as a surgical intervention .  The patient's history has been reviewed, patient examined, no change in status, stable for surgery.  I have reviewed the patient's chart and labs.  Questions were answered to the patient's satisfaction.     April Rodriguez

## 2017-04-09 NOTE — Op Note (Signed)
EGD was performed for long-standing gastroesophageal reflux.  The esophagus appeared unremarkable. Z line was regular at 36 cm from insertion. Mild erythema was noted in the antrum, biopsies have been taken to rule out H. pylori infection. The duodenum appeared unremarkable. Retroflexion showed a normal cardia and fundus. There was no evidence of hiatal hernia.  Colonoscopy was performed for screening. Last colonoscopy from 2011 had poor prep. Patient was given colonic prep over a period of 2 days. The prep noted on this colonoscopy was good and adequate to identify polyps more than 6 mm in size. The entire colon appeared unremarkable. Terminal ileum appeared unremarkable. Retroflexion was normal. Recommend repeat colonoscopy in 10 years for screening purposes.  Okay to DC home from GI standpoint. Pathology will be followed up as an outpatient, will treat H. pylori found.   Ronnette Juniper, M.D.

## 2017-04-09 NOTE — Op Note (Signed)
[facility] Patient Name: April Rodriguez Procedure Date: 04/09/2017 MRN: [id number] Attending MD: Ronnette Juniper , MD Date of Birth: [birth year] CSN: [id number] Age: 62 Admit Type: Outpatient Procedure:                Upper GI endoscopy Indications:              Suspected gastro-esophageal reflux disease for over                            5 years Providers:                Ronnette Juniper, MD, Burtis Junes, RN, Tinnie Gens,                            Technician, Christell Faith, CRNA Referring MD:              Medicines:                Monitored Anesthesia Care Complications:            No immediate complications. Estimated Blood Loss:     Estimated blood loss: none. Procedure:                Pre-Anesthesia Assessment:                           - Prior to the procedure, a History and Physical                            was performed, and patient medications and                            allergies were reviewed. The patient's tolerance of                            previous anesthesia was also reviewed. The risks                            and benefits of the procedure and the sedation                            options and risks were discussed with the patient.                            All questions were answered, and informed consent                            was obtained. Prior Anticoagulants: The patient has                            taken no previous anticoagulant or antiplatelet                            agents. ASA Grade Assessment: II - A patient with  mild systemic disease. After reviewing the risks                            and benefits, the patient was deemed in                            satisfactory condition to undergo the procedure.                           After obtaining informed consent, the endoscope was                            passed under direct vision. Throughout the                            procedure, the patient's blood  pressure, pulse, and                            oxygen saturations were monitored continuously. The                            [id number] ([id number]) scope was introduced through the                            mouth, and advanced to the second part of duodenum.                            The upper GI endoscopy was accomplished without                            difficulty. The patient tolerated the procedure                            well. Scope In: Scope Out: Findings:      The examined esophagus was normal.      The Z-line was regular and was found 36 cm from the incisors.      Localized mildly erythematous mucosa without bleeding was found in the       gastric antrum. Biopsies were taken with a cold forceps for Helicobacter       pylori testing.      The cardia and gastric fundus were normal on retroflexion.      The examined duodenum was normal. Impression:               - Normal esophagus.                           - Z-line regular, 36 cm from the incisors.                           - Erythematous mucosa in the antrum. Biopsied.                           - Normal examined duodenum. Moderate Sedation:      Patient did not receive moderate sedation for this procedure,  but       instead received monitored anesthesia care. Recommendation:           - Patient has a contact number available for                            emergencies. The signs and symptoms of potential                            delayed complications were discussed with the                            patient. Return to normal activities tomorrow.                            Written discharge instructions were provided to the                            patient.                           - Resume regular diet.                           - Continue present medications.                           - Await pathology results.                           - Aggressive anti reflux measures such as weight                             loss, elevate head end of the bed during sleep,                            avoid or limit caffeinated products and space last                            meal of the day and bedtime by at least 3 hours. Procedure Code(s):        --- Professional ---                           [phone], Esophagogastroduodenoscopy, flexible,                            transoral; with biopsy, single or multiple Diagnosis Code(s):        --- Professional ---                           K31.89, Other diseases of stomach and duodenum CPT copyright 2016 American Medical Association. All rights reserved. The codes documented in this report are preliminary and upon coder review may  be revised to meet current compliance requirements. Ronnette Juniper, MD 04/09/2017 12:27:19 PM This report has been signed electronically. Number of Addenda: 0

## 2017-04-09 NOTE — Brief Op Note (Signed)
04/09/2017  12:30 PM  PATIENT:  April Rodriguez  62 y.o. female  PRE-OPERATIVE DIAGNOSIS:  Screen for colon cancer, Chronic gastroesophageal reflux disease  POST-OPERATIVE DIAGNOSIS:  EGD mild gastritis, colon normal  PROCEDURE:  Procedure(s): COLONOSCOPY (N/A) ESOPHAGOGASTRODUODENOSCOPY (EGD) (N/A)  SURGEON:  Surgeon(s) and Role:    Ronnette Juniper, MD - Primary  PHYSICIAN ASSISTANT:   ASSISTANTS: Burtis Junes, RN, Tinnie Gens, Tech  ANESTHESIA:   MAC  EBL:  none  BLOOD ADMINISTERED:none  DRAINS: none   LOCAL MEDICATIONS USED:  NONE  SPECIMEN:  Biopsy / Limited Resection  DISPOSITION OF SPECIMEN:  PATHOLOGY  COUNTS:  YES  TOURNIQUET:  * No tourniquets in log *  DICTATION: .Dragon Dictation  PLAN OF CARE: Discharge to home after PACU  PATIENT DISPOSITION:  PACU - hemodynamically stable.   Delay start of Pharmacological VTE agent (>24hrs) due to surgical blood loss or risk of bleeding: no

## 2017-04-09 NOTE — Op Note (Signed)
[facility] Patient Name: April Rodriguez Procedure Date: 04/09/2017 MRN: [id number] Attending MD: Ronnette Juniper , MD Date of Birth: 10-Aug-1955 CSN: [id number] Age: 62 Admit Type: Outpatient Procedure:                Colonoscopy Indications:              Screening for colorectal malignant neoplasm, Last                            colonoscopy: 2011(poor prep) Providers:                Ronnette Juniper, MD, Burtis Junes, RN, Tinnie Gens,                            Technician, Christell Faith, CRNA Referring MD:              Medicines:                Monitored Anesthesia Care Complications:            No immediate complications. Estimated Blood Loss:     Estimated blood loss: none. Procedure:                Pre-Anesthesia Assessment:                           - Prior to the procedure, a History and Physical                            was performed, and patient medications and                            allergies were reviewed. The patient's tolerance of                            previous anesthesia was also reviewed. The risks                            and benefits of the procedure and the sedation                            options and risks were discussed with the patient.                            All questions were answered, and informed consent                            was obtained. Prior Anticoagulants: The patient has                            taken no previous anticoagulant or antiplatelet                            agents. ASA Grade Assessment: II - A patient with  mild systemic disease. After reviewing the risks                            and benefits, the patient was deemed in                            satisfactory condition to undergo the procedure.                           After obtaining informed consent, the colonoscope                            was passed under direct vision. Throughout the                            procedure, the  patient's blood pressure, pulse, and                            oxygen saturations were monitored continuously. The                            EC-[id number] ([id number]) scope was introduced through                            the anus and advanced to the the terminal ileum.                            The colonoscopy was performed without difficulty.                            The patient tolerated the procedure well. The                            quality of the bowel preparation was good. The                            terminal ileum, ileocecal valve, appendiceal                            orifice, and rectum were photographed. Scope In: 11:58:47 AM Scope Out: 12:23:30 PM Scope Withdrawal Time: 0 hours 12 minutes 58 seconds  Total Procedure Duration: 0 hours 24 minutes 43 seconds  Findings:      The perianal and digital rectal examinations were normal.      The terminal ileum appeared normal.      The colon (entire examined portion) appeared normal.      The retroflexed view of the distal rectum and anal verge was normal and       showed no anal or rectal abnormalities. Impression:               - The examined portion of the ileum was normal.                           - The entire examined colon is normal.                           -  The distal rectum and anal verge are normal on                            retroflexion view.                           - No specimens collected. Moderate Sedation:      Patient did not receive moderate sedation for this procedure, but       instead received monitored anesthesia care. Recommendation:           - Patient has a contact number available for                            emergencies. The signs and symptoms of potential                            delayed complications were discussed with the                            patient. Return to normal activities tomorrow.                            Written discharge instructions were provided to the                             patient.                           - Resume regular diet.                           - Continue present medications.                           - Repeat colonoscopy in 10 years for screening                            purposes. Procedure Code(s):        --- Professional ---                           Z0258, Colorectal cancer screening; colonoscopy on                            individual not meeting criteria for high risk Diagnosis Code(s):        --- Professional ---                           Z12.11, Encounter for screening for malignant                            neoplasm of colon CPT copyright 2016 American Medical Association. All rights reserved. The codes documented in this report are preliminary and upon coder review may  be revised to meet current compliance requirements. Ronnette Juniper, MD 04/09/2017 12:29:06 PM This report has been signed electronically. Number of Addenda: 0

## 2017-04-11 ENCOUNTER — Encounter (HOSPITAL_COMMUNITY): Payer: Self-pay | Admitting: Gastroenterology

## 2017-04-12 MED FILL — FLUTICASONE PROP 50 MCG SPR: 50 | 30 days supply | Qty: 16 | Fill #0

## 2017-04-12 MED FILL — VENLAFAXINE HCL 100 MG TAB: 100 | 90 days supply | Qty: 180 | Fill #0

## 2017-04-12 MED FILL — busPIRone HCL 5 MG TABS: 5 | 30 days supply | Qty: 90 | Fill #0

## 2017-04-16 DIAGNOSIS — M8588 Other specified disorders of bone density and structure, other site: Secondary | ICD-10-CM | POA: Diagnosis not present

## 2017-04-16 MED FILL — TOPIRAMATE 25 MG TAB: 25 | 30 days supply | Qty: 30 | Fill #0

## 2017-04-19 MED FILL — AMITIZA 24 MCG CAPSULES: 24 | 30 days supply | Qty: 60 | Fill #0

## 2017-04-26 DIAGNOSIS — J101 Influenza due to other identified influenza virus with other respiratory manifestations: Secondary | ICD-10-CM | POA: Diagnosis not present

## 2017-04-26 MED FILL — OSELTAMIVIR PHOSPHATE 75 MG: 75 | 5 days supply | Qty: 10 | Fill #0

## 2017-04-26 MED FILL — HYDROCODONE-CHLORPHEN ER SU: 10-8 | 5 days supply | Qty: 50 | Fill #0

## 2017-05-16 MED FILL — TOPIRAMATE 25 MG TAB: 25 | 30 days supply | Qty: 30 | Fill #1

## 2017-05-22 MED FILL — busPIRone HCL 5 MG TABS: 5 | 30 days supply | Qty: 90 | Fill #1

## 2017-05-24 ENCOUNTER — Other Ambulatory Visit: Payer: Self-pay | Admitting: Obstetrics and Gynecology

## 2017-05-24 DIAGNOSIS — Z139 Encounter for screening, unspecified: Secondary | ICD-10-CM

## 2017-05-30 DIAGNOSIS — K5904 Chronic idiopathic constipation: Secondary | ICD-10-CM | POA: Diagnosis not present

## 2017-05-30 MED FILL — TRULANCE 3 MG TABLET: 3 | 90 days supply | Qty: 90 | Fill #0

## 2017-05-31 MED FILL — PRAVASTATIN NA 40 MG TAB: 40 | 90 days supply | Qty: 90 | Fill #3

## 2017-06-03 MED FILL — YUVAFEM 10 MCG VAGINAL INSE: 10 | 60 days supply | Qty: 18 | Fill #1

## 2017-06-19 MED FILL — TOPIRAMATE 25 MG TAB: 25 | 30 days supply | Qty: 30 | Fill #2

## 2017-06-19 MED FILL — busPIRone HCL 5 MG TABS: 5 | 30 days supply | Qty: 90 | Fill #2

## 2017-06-24 ENCOUNTER — Ambulatory Visit
Admission: RE | Admit: 2017-06-24 | Discharge: 2017-06-24 | Disposition: A | Discharge status: Home / Self Care | Payer: 59 | Source: Ambulatory Visit | Attending: Obstetrics and Gynecology | Admitting: Obstetrics and Gynecology

## 2017-06-24 DIAGNOSIS — Z139 Encounter for screening, unspecified: Secondary | ICD-10-CM

## 2017-06-24 DIAGNOSIS — Z1231 Encounter for screening mammogram for malignant neoplasm of breast: Secondary | ICD-10-CM | POA: Diagnosis not present

## 2017-06-28 MED FILL — FLUTICASONE PROP 50 MCG SPR: 50 | 30 days supply | Qty: 16 | Fill #1

## 2017-07-09 MED FILL — LOSARTAN POTASSIUM 50 MG TA: 50 | 90 days supply | Qty: 90 | Fill #1

## 2017-07-09 MED FILL — VENLAFAXINE HCL 100 MG TAB: 100 | 90 days supply | Qty: 180 | Fill #1

## 2017-07-17 MED FILL — METOPROLOL TARTRATE 25 MG T: 25 | 90 days supply | Qty: 90 | Fill #1

## 2017-07-17 MED FILL — TOPIRAMATE 25 MG TAB: 25 | 30 days supply | Qty: 30 | Fill #3

## 2017-07-26 MED FILL — HYDROCHLOROTHIAZIDE 12.5 MG: 12.5 | 90 days supply | Qty: 90 | Fill #1

## 2017-07-29 DIAGNOSIS — G44209 Tension-type headache, unspecified, not intractable: Secondary | ICD-10-CM | POA: Diagnosis not present

## 2017-07-29 DIAGNOSIS — F514 Sleep terrors [night terrors]: Secondary | ICD-10-CM | POA: Diagnosis not present

## 2017-07-29 DIAGNOSIS — I10 Essential (primary) hypertension: Secondary | ICD-10-CM | POA: Diagnosis not present

## 2017-07-29 DIAGNOSIS — J301 Allergic rhinitis due to pollen: Secondary | ICD-10-CM | POA: Diagnosis not present

## 2017-07-29 MED FILL — tiZANidine HCL 4 MG TABS: 4 | 3 days supply | Qty: 10 | Fill #0

## 2017-07-29 MED FILL — busPIRone HCL 5 MG TABS: 5 | 90 days supply | Qty: 270 | Fill #0

## 2017-07-29 MED FILL — ONDANSETRON HCL 4 MG TABLET: 4 | 7 days supply | Qty: 10 | Fill #0

## 2017-08-07 DIAGNOSIS — K219 Gastro-esophageal reflux disease without esophagitis: Secondary | ICD-10-CM | POA: Diagnosis not present

## 2017-08-07 DIAGNOSIS — K5904 Chronic idiopathic constipation: Secondary | ICD-10-CM | POA: Diagnosis not present

## 2017-08-07 DIAGNOSIS — R1032 Left lower quadrant pain: Secondary | ICD-10-CM | POA: Diagnosis not present

## 2017-08-07 MED FILL — HYOSCYAMINE ER 0.375 MG TAB: 0.375 | 30 days supply | Qty: 30 | Fill #0

## 2017-08-14 MED FILL — TOPIRAMATE 25 MG TAB: 25 | 30 days supply | Qty: 30 | Fill #4

## 2017-08-21 MED FILL — URO-MP CAPSULE: 118 | 10 days supply | Qty: 30 | Fill #2

## 2017-09-11 MED FILL — ESTRADIOL 10 MCG TABS: 10 | 60 days supply | Qty: 18 | Fill #2

## 2017-09-13 MED FILL — TOPIRAMATE 25 MG TAB: 25 | 30 days supply | Qty: 30 | Fill #5

## 2017-09-13 MED FILL — TRULANCE 3 MG TABLET: 3 | 90 days supply | Qty: 90 | Fill #1

## 2017-09-13 MED FILL — PRAVASTATIN NA 40 MG TAB: 40 | 90 days supply | Qty: 90 | Fill #0

## 2017-09-24 DIAGNOSIS — N3010 Interstitial cystitis (chronic) without hematuria: Secondary | ICD-10-CM | POA: Diagnosis not present

## 2017-10-04 MED FILL — FLUCONAZOLE 150 MG TABS: 150 | 1 days supply | Qty: 1 | Fill #0

## 2017-10-07 MED FILL — VENLAFAXINE HCL 100 MG TAB: 100 | 90 days supply | Qty: 180 | Fill #2

## 2017-10-09 DIAGNOSIS — N3010 Interstitial cystitis (chronic) without hematuria: Secondary | ICD-10-CM | POA: Diagnosis not present

## 2017-10-11 MED FILL — LOSARTAN POTASSIUM 50 MG TA: 50 | 90 days supply | Qty: 90 | Fill #2

## 2017-10-11 MED FILL — FLUTICASONE PROP 50 MCG SPR: 50 | 30 days supply | Qty: 16 | Fill #2

## 2017-10-11 MED FILL — TOPIRAMATE 25 MG TAB: 25 | 30 days supply | Qty: 30 | Fill #0

## 2017-10-23 MED FILL — HYDROCHLOROTHIAZIDE 12.5 MG: 12.5 | 90 days supply | Qty: 90 | Fill #2

## 2017-10-23 MED FILL — PANTOPRAZOLE SOD DR 40 MG T: 40 | 90 days supply | Qty: 90 | Fill #1

## 2017-11-04 DIAGNOSIS — R0683 Snoring: Secondary | ICD-10-CM | POA: Diagnosis not present

## 2017-11-04 DIAGNOSIS — G478 Other sleep disorders: Secondary | ICD-10-CM | POA: Diagnosis not present

## 2017-11-04 DIAGNOSIS — G4700 Insomnia, unspecified: Secondary | ICD-10-CM | POA: Diagnosis not present

## 2017-11-04 DIAGNOSIS — F515 Nightmare disorder: Secondary | ICD-10-CM | POA: Diagnosis not present

## 2017-11-06 MED FILL — busPIRone HCL 5 MG TABS: 5 | 90 days supply | Qty: 270 | Fill #1

## 2017-11-06 MED FILL — MECLIZINE 25 MG TABLET: 25 | 10 days supply | Qty: 10 | Fill #1

## 2017-11-18 MED FILL — TOPIRAMATE 25 MG TAB: 25 | 30 days supply | Qty: 30 | Fill #1

## 2017-11-20 DIAGNOSIS — F515 Nightmare disorder: Secondary | ICD-10-CM | POA: Diagnosis not present

## 2017-11-20 DIAGNOSIS — R0683 Snoring: Secondary | ICD-10-CM | POA: Diagnosis not present

## 2017-11-20 DIAGNOSIS — G4734 Idiopathic sleep related nonobstructive alveolar hypoventilation: Secondary | ICD-10-CM | POA: Diagnosis not present

## 2017-11-20 DIAGNOSIS — F5104 Psychophysiologic insomnia: Secondary | ICD-10-CM | POA: Diagnosis not present

## 2017-11-21 ENCOUNTER — Other Ambulatory Visit (HOSPITAL_BASED_OUTPATIENT_CLINIC_OR_DEPARTMENT_OTHER): Payer: Self-pay

## 2017-11-21 DIAGNOSIS — G4700 Insomnia, unspecified: Secondary | ICD-10-CM

## 2017-11-21 DIAGNOSIS — G4710 Hypersomnia, unspecified: Secondary | ICD-10-CM

## 2017-11-21 DIAGNOSIS — R0683 Snoring: Secondary | ICD-10-CM

## 2017-11-21 DIAGNOSIS — R5383 Other fatigue: Secondary | ICD-10-CM

## 2017-12-12 MED FILL — PRAVASTATIN NA 40 MG TAB: 40 | 90 days supply | Qty: 90 | Fill #1

## 2017-12-16 ENCOUNTER — Ambulatory Visit (HOSPITAL_BASED_OUTPATIENT_CLINIC_OR_DEPARTMENT_OTHER): Payer: 59 | Attending: Internal Medicine | Admitting: Internal Medicine

## 2017-12-16 VITALS — Ht 63.0 in | Wt 129.0 lb

## 2017-12-16 DIAGNOSIS — G4700 Insomnia, unspecified: Secondary | ICD-10-CM

## 2017-12-16 DIAGNOSIS — G4733 Obstructive sleep apnea (adult) (pediatric): Secondary | ICD-10-CM

## 2017-12-16 DIAGNOSIS — G4710 Hypersomnia, unspecified: Secondary | ICD-10-CM

## 2017-12-16 DIAGNOSIS — R5383 Other fatigue: Secondary | ICD-10-CM

## 2017-12-16 DIAGNOSIS — R0683 Snoring: Secondary | ICD-10-CM

## 2017-12-19 MED FILL — TRULANCE 3 MG TABLET: 3 | 90 days supply | Qty: 90 | Fill #2

## 2017-12-19 MED FILL — TOPIRAMATE 25 MG TAB: 25 | 30 days supply | Qty: 30 | Fill #2

## 2017-12-25 NOTE — Procedures (Signed)
    NAME: April Rodriguez DATE OF BIRTH:  1955/12/13 MEDICAL RECORD NUMBER [id number]  LOCATION: [facility]  PHYSICIAN: Marius Ditch  DATE OF STUDY: 12/16/2017  SLEEP STUDY TYPE: Out of Center Sleep Test                REFERRING PHYSICIAN: Marius Ditch, MD  INDICATION FOR STUDY: abnormal overnight oximetry in a pattern consistent with OSA.   EPWORTH SLEEPINESS SCORE:  8 HEIGHT: 5\' 3"  (160 cm)  WEIGHT: 129 lb (58.5 kg)    Body mass index is 22.85 kg/m.  NECK SIZE: 12.5 in.  Conclusions: 1. Very mild sleep apnea (Respiratory Event Index (REI) 5.5/hr) - please note that REI approximates AHI but is not identical as a HST measures time of use and not time of sleep. 2. Mild desaturations (min O2 sat: 83%; time below 89%: 12 minutes)  Recommendations: 1. It is unlikely that sleep apnea of this degree is clinically significant. The hypoxemia that is present is also not significant. 2. There is no indication for CPAP or any other intervention for sleep disordered breathing.  3. Please note that a HST may underestimate the degree of obstructive sleep apnea due to the lack of EEG data. Therefore, respiratory effort related arousals (RERAs) will not be measured. If a prominent part of the patient's sleep disordered breathing are RERAs, then the HST will understate the severity of the patient's obstructive sleep apnea.  4. Please note that AASM recommends an in-lab sleep test for patients with a negative HST who have a high pretest likelihood of sleep apnea. If pretest likelihood is not thought to be high, the test excludes significant sleep apnea.  This study is a Type III Home Sleep Apnea Test measuring respiratory effort, airflow, heart rate, and oxygen saturation.   I certify that I have reviewed the entire raw data recording prior to the issuance of this report in accordance with the Standards of the American Academy of Sleep Medicine (AASM).   Marius Ditch Sleep specialist, [place] [employer]  ELECTRONICALLY SIGNED ON:  12/25/2017, 8:12 PM [place] PH: [phone]   FX: [phone] [place]

## 2017-12-30 DIAGNOSIS — D013 Carcinoma in situ of anus and anal canal: Secondary | ICD-10-CM | POA: Diagnosis not present

## 2017-12-30 MED FILL — VENLAFAXINE HCL 100 MG TAB: 100 | 90 days supply | Qty: 180 | Fill #0

## 2018-01-16 MED FILL — URO-MP CAPSULE: 118 | 10 days supply | Qty: 20 | Fill #0

## 2018-01-20 ENCOUNTER — Ambulatory Visit (INDEPENDENT_AMBULATORY_CARE_PROVIDER_SITE_OTHER): Payer: 59 | Admitting: Nurse Practitioner

## 2018-01-20 ENCOUNTER — Encounter: Payer: Self-pay | Admitting: Nurse Practitioner

## 2018-01-20 VITALS — BP 132/80 | HR 71 | Ht 63.0 in | Wt 129.8 lb

## 2018-01-20 DIAGNOSIS — R000 Tachycardia, unspecified: Secondary | ICD-10-CM

## 2018-01-20 DIAGNOSIS — I10 Essential (primary) hypertension: Secondary | ICD-10-CM

## 2018-01-20 DIAGNOSIS — E785 Hyperlipidemia, unspecified: Secondary | ICD-10-CM

## 2018-01-20 MED FILL — METOPROLOL TARTRATE 25 MG T: 25 | 90 days supply | Qty: 45 | Fill #0

## 2018-01-20 MED FILL — HYDROCHLOROTHIAZIDE 12.5 MG: 12.5 | 90 days supply | Qty: 90 | Fill #3

## 2018-01-20 NOTE — Patient Instructions (Addendum)
We will be checking the following labs today - NONE   Medication Instructions:    Continue with your current medicines.    If you need a refill on your cardiac medications before your next appointment, please call your pharmacy.     Testing/Procedures To Be Arranged:  N/A  Follow-Up:   See me in one year.     At [facility], you and your health needs are our priority.  As part of our continuing mission to provide you with exceptional heart care, we have created designated Provider Care Teams.  These Care Teams include your primary Cardiologist (physician) and Advanced Practice Providers (APPs -  Physician Assistants and Nurse Practitioners) who all work together to provide you with the care you need, when you need it.  Special Instructions:  . None  Call the [facility] office at [phone] if you have any questions, problems or concerns.       

## 2018-01-20 NOTE — Progress Notes (Signed)
CARDIOLOGY OFFICE NOTE  Date:  01/20/2018    April Rodriguez Date of Birth: Aug 11, 1955 Medical Record #[id number]  PCP:  Leeroy Cha, MD  Cardiologist:  Starr Lake    Chief Complaint  Patient presents with  . Hypertension  . Hyperlipidemia    1 year follow up - seen for Dr. Irish Lack    History of Present Illness: April Rodriguez is a 62 y.o. female who presents today for a one year check. Seen for Dr. Irish Lack. He also sees her sister.   She has had prior cardiac cath back in 2017 for chest pain - this showed just very mild nonobstructive CAD. FH quite + for CAD - sister with prior stents. Other issues include HLD, HTN and GERD.   I have followed her since - she has had some intermittent chest pain related to stress with dealing with elderly parents. Father with dementia and mother with prior stroke. They ended up being placed in long term care facility. Last seen a year ago and she was doing ok but still pretty overwhelmed with her family situation. Her husband now has lung cancer. She has had her Toprol increased for elevated HR.   Comes in today. Here alone. She has continued to have some chest pain off and on. Shortness of breath may be a little worse a times. She attributes all of this to stress. She has no exertional symptoms whatsoever. Labs are checked by her PCP. Seeing them later this month. She feels good on her medicines. BP is good for the most part. She has no real concerns. Trying to stay active but her family situation is challenging at times.   Past Medical History:  Diagnosis Date  . AIN grade III   . Allergic rhinitis   . Anxiety   . Chronic constipation   . Depression   . Family history of adverse reaction to anesthesia    sister gets nauseated  . Frequency of urination   . GERD (gastroesophageal reflux disease)   . Headache    migraines  . History of hiatal hernia   . Hypertension   . IBS (irritable bowel syndrome)   . Rash     UNDER BREAST    Past Surgical History:  Procedure Laterality Date  . [place] BUNIONECTOMY RIGHT FOOT  03-07-2000  . BREAST BIOPSY    . CARDIAC CATHETERIZATION N/A 10/21/2015   Procedure: Left Heart Cath and Coronary Angiography;  Surgeon: Jettie Booze, MD;  Location: [facility];  Service: Cardiovascular;  Laterality: N/A;  . CATARACT EXTRACTION W/ INTRAOCULAR LENS  IMPLANT, BILATERAL  2011  . COLONOSCOPY  12-15-2009  . COLONOSCOPY N/A 04/09/2017   Procedure: COLONOSCOPY;  Surgeon: Ronnette Juniper, MD;  [facility]: [facility];  Service: Gastroenterology;  Laterality: N/A;  . COLONOSCOPY WITH PROPOFOL N/A 03/08/2016   Procedure: COLONOSCOPY WITH PROPOFOL;  Surgeon: Garlan Fair, MD;  Location: [facility];  Service: Endoscopy;  Laterality: N/A;  . CYSTO/  BLADDER BX/  HYDRODISTENTION  01-30-2006  . ESOPHAGOGASTRODUODENOSCOPY N/A 04/09/2017   Procedure: ESOPHAGOGASTRODUODENOSCOPY (EGD);  Surgeon: Ronnette Juniper, MD;  Location: [facility];  Service: Gastroenterology;  Laterality: N/A;  . [place]  . LEFT BREAST BX  1980's   BENIGN  . RECTAL EXAM UNDER ANESTHESIA N/A 12/10/2014   Procedure: ANAL EXAM UNDER ANESTHESIA  EXCISIONAL BIOPSY OF PERIANAL NEOPLASM;  Surgeon: [name], MD;  Location: [facility];  Service: General;  Laterality: N/A;  .  TUBAL LIGATION  1980's     Medications: Current Meds  Medication Sig  . aspirin-acetaminophen-caffeine (EXCEDRIN MIGRAINE) 250-250-65 MG tablet Take 1 tablet by mouth daily as needed for headache.  . busPIRone (BUSPAR) 5 MG tablet Take 5 mg by mouth 3 (three) times daily.  . clotrimazole-betamethasone (LOTRISONE) cream Apply 1 application topically as needed (rash).   Marland Kitchen docusate sodium (COLACE) 100 MG capsule Take 100 mg by mouth 2 (two) times daily as needed for mild constipation.  Marland Kitchen FIBER PO Take 1 capsule by mouth daily as needed (constipation).  . fluticasone (FLONASE) 50 MCG/ACT nasal spray Place 2  sprays daily into both nostrils.  . hydrochlorothiazide (MICROZIDE) 12.5 MG capsule Take 12.5 mg by mouth daily.  Marland Kitchen Hyoscyamine Sulfate 0.375 MG TBCR Take 0.375 mg by mouth as needed (STOMACH SPASMS).   Marland Kitchen ibuprofen (ADVIL,MOTRIN) 200 MG tablet Take 200-400 mg by mouth daily as needed for headache or moderate pain.  Marland Kitchen levocetirizine (XYZAL) 5 MG tablet Take 5 mg by mouth daily as needed for allergies. If Claritin doesn't work  . loratadine (CLARITIN) 10 MG tablet Take 10 mg by mouth daily as needed for allergies.  Marland Kitchen losartan (COZAAR) 50 MG tablet Take 50 mg by mouth daily.  . meclizine (ANTIVERT) 25 MG tablet Take 25 mg by mouth as needed (DIZZINESS).   . Melatonin 5 MG TABS Take 5 mg by mouth at bedtime as needed (sleep).  . metoprolol tartrate (LOPRESSOR) 25 MG tablet Take 12.5 mg by mouth 2 (two) times daily.  Marland Kitchen MINERAL OIL PO Take by mouth as needed (2 TABLESPOONS  AS FOR CONSTIPATION).  Marland Kitchen ondansetron (ZOFRAN) 4 MG tablet Take 4 mg by mouth 2 (two) times daily as needed for nausea or vomiting.  . pantoprazole (PROTONIX) 40 MG tablet Take 40 mg by mouth daily.   . pravastatin (PRAVACHOL) 40 MG tablet Take 40 mg by mouth daily.   Marland Kitchen tiZANidine (ZANAFLEX) 4 MG tablet Take 4 mg by mouth 3 (three) times daily as needed for muscle spasms.  Marland Kitchen topiramate (TOPAMAX) 25 MG tablet Take 25 mg by mouth daily.  . TRULANCE 3 MG TABS Take 1 tablet by mouth daily.  Marland Kitchen venlafaxine (EFFEXOR) 100 MG tablet Take 100 mg by mouth 2 (two) times daily.     Allergies: Allergies  Allergen Reactions  . Erythromycin Diarrhea    GI upset    Social History: The patient  reports that she has never smoked. She has never used smokeless tobacco. She reports that she does not drink alcohol or use drugs.   Family History: The patient's family history includes Aneurysm in her sister; Breast cancer (age of onset: 38) in her mother; Emphysema in her sister; Heart attack in her mother; Lung cancer in her sister; Prostate  cancer in her father; Rheum arthritis in her mother; Stroke in her brother and mother.   Review of Systems: Please see the history of present illness.   Otherwise, the review of systems is positive for none.   All other systems are reviewed and negative.   Physical Exam: VS:  BP 132/80 (BP Location: Left Arm, Patient Position: Sitting, Cuff Size: Normal)   Pulse 71   Ht 5\' 3"  (1.6 m)   Wt 129 lb 12.8 oz (58.9 kg)   BMI 22.99 kg/m  .  BMI Body mass index is 22.99 kg/m.  Wt Readings from Last 3 Encounters:  01/20/18 129 lb 12.8 oz (58.9 kg)  12/16/17 129 lb (58.5 kg)  04/09/17  127 lb (57.6 kg)    General: Pleasant. Well developed, well nourished and in no acute distress.   HEENT: Normal.  Neck: Supple, no JVD, carotid bruits, or masses noted.  Cardiac: Regular rate and rhythm. No murmurs, rubs, or gallops. No edema.  Respiratory:  Lungs are clear to auscultation bilaterally with normal work of breathing.  GI: Soft and nontender.  MS: No deformity or atrophy. Gait and ROM intact.  Skin: Warm and dry. Color is normal.  Neuro:  Strength and sensation are intact and no gross focal deficits noted.  Psych: Alert, appropriate and with normal affect.   LABORATORY DATA:  EKG:  EKG is ordered today. This demonstrates NSR - incomplete RBBB, poor R wave progression - unchanged.  Lab Results  Component Value Date   WBC 4.5 10/15/2015   HGB 13.5 10/15/2015   HCT 40.6 10/15/2015   PLT 222 10/15/2015   GLUCOSE 107 (H) 10/15/2015   NA 141 10/15/2015   K 3.6 10/15/2015   CL 107 10/15/2015   CREATININE 0.71 10/15/2015   BUN 8 10/15/2015   CO2 26 10/15/2015   INR 1.11 10/21/2015      BNP (last 3 results) No results for input(s): BNP in the last 8760 hours.  ProBNP (last 3 results) No results for input(s): PROBNP in the last 8760 hours.   Other Studies Reviewed Today:  Cardiac Cath Conclusion from 10/2015   Ost RCA to Prox RCA lesion, 10% stenosed.  The left ventricular  systolic function is normal.  No significant cortonary artery disease.  Continue aggressive preventive therapy     Assessment/Plan:  1. 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 Stress continues to play a big role.   2. HTN - BP looks good on her current regime. She does check periodically at work/home. No changes made today.   3. HLD - she remains on statin - her labs are checked by her PCP.   4. +FH for CAD       5. Situational stress - encouraged her to try to make some "me" time.    Current medicines are reviewed with the patient today.  The patient does not have concerns regarding medicines other than what has been noted above.  The following changes have been made:  See above.  Labs/ tests ordered today include:    Orders Placed This Encounter  Procedures  . EKG 12-Lead     Disposition:   FU with me in 1 year.   Patient is agreeable to this plan and will call if any problems develop in the interim.   SignedTruitt Merle, NP  01/20/2018 8:38 AM  [facility] [address] [place], [place]  [address] Phone: [phone] Fax: [phone]

## 2018-01-22 MED FILL — FLUTICASONE PROP 50 MCG SPR: 50 | 30 days supply | Qty: 16 | Fill #3

## 2018-01-22 MED FILL — TOPIRAMATE 25 MG TAB: 25 | 30 days supply | Qty: 30 | Fill #3

## 2018-01-29 DIAGNOSIS — G4700 Insomnia, unspecified: Secondary | ICD-10-CM | POA: Diagnosis not present

## 2018-01-29 DIAGNOSIS — F419 Anxiety disorder, unspecified: Secondary | ICD-10-CM | POA: Diagnosis not present

## 2018-01-29 DIAGNOSIS — I10 Essential (primary) hypertension: Secondary | ICD-10-CM | POA: Diagnosis not present

## 2018-01-29 DIAGNOSIS — F329 Major depressive disorder, single episode, unspecified: Secondary | ICD-10-CM | POA: Diagnosis not present

## 2018-01-29 DIAGNOSIS — R5383 Other fatigue: Secondary | ICD-10-CM | POA: Diagnosis not present

## 2018-01-29 DIAGNOSIS — G44209 Tension-type headache, unspecified, not intractable: Secondary | ICD-10-CM | POA: Diagnosis not present

## 2018-01-29 DIAGNOSIS — Z1159 Encounter for screening for other viral diseases: Secondary | ICD-10-CM | POA: Diagnosis not present

## 2018-01-29 DIAGNOSIS — G43909 Migraine, unspecified, not intractable, without status migrainosus: Secondary | ICD-10-CM | POA: Diagnosis not present

## 2018-01-29 DIAGNOSIS — K589 Irritable bowel syndrome without diarrhea: Secondary | ICD-10-CM | POA: Diagnosis not present

## 2018-01-29 DIAGNOSIS — Z0000 Encounter for general adult medical examination without abnormal findings: Secondary | ICD-10-CM | POA: Diagnosis not present

## 2018-02-03 MED FILL — LOSARTAN POTASSIUM 50 MG TA: 50 | 90 days supply | Qty: 90 | Fill #0

## 2018-02-03 MED FILL — LEVOCETIRIZINE 5 MG TABLET: 5 | 90 days supply | Qty: 90 | Fill #0

## 2018-02-05 DIAGNOSIS — J029 Acute pharyngitis, unspecified: Secondary | ICD-10-CM | POA: Diagnosis not present

## 2018-02-05 DIAGNOSIS — J0110 Acute frontal sinusitis, unspecified: Secondary | ICD-10-CM | POA: Diagnosis not present

## 2018-02-07 MED FILL — BENZONATATE 200 MG CAPS: 200 | 5 days supply | Qty: 15 | Fill #0

## 2018-02-13 MED FILL — ESTRADIOL 10 MCG TABS: 10 | 28 days supply | Qty: 8 | Fill #0

## 2018-02-25 MED FILL — TOPIRAMATE 25 MG TAB: 25 | 30 days supply | Qty: 30 | Fill #0

## 2018-03-01 ENCOUNTER — Telehealth: Payer: 59 | Admitting: Family

## 2018-03-01 DIAGNOSIS — J019 Acute sinusitis, unspecified: Secondary | ICD-10-CM

## 2018-03-01 DIAGNOSIS — B9689 Other specified bacterial agents as the cause of diseases classified elsewhere: Secondary | ICD-10-CM | POA: Diagnosis not present

## 2018-03-01 MED ORDER — AMOXICILLIN-POT CLAVULANATE 875-125 MG PO TABS: 1.0000 | ORAL_TABLET | Freq: Two times a day (BID) | ORAL | 0 refills | Status: DC

## 2018-03-01 NOTE — Progress Notes (Signed)

## 2018-03-04 DIAGNOSIS — Z01419 Encounter for gynecological examination (general) (routine) without abnormal findings: Secondary | ICD-10-CM | POA: Diagnosis not present

## 2018-03-04 DIAGNOSIS — Z6822 Body mass index (BMI) 22.0-22.9, adult: Secondary | ICD-10-CM | POA: Diagnosis not present

## 2018-03-05 DIAGNOSIS — H52203 Unspecified astigmatism, bilateral: Secondary | ICD-10-CM | POA: Diagnosis not present

## 2018-03-13 MED FILL — PRAVASTATIN NA 40 MG TAB: 40 | 90 days supply | Qty: 90 | Fill #2

## 2018-03-21 DIAGNOSIS — R350 Frequency of micturition: Secondary | ICD-10-CM | POA: Diagnosis not present

## 2018-03-21 MED FILL — TOPIRAMATE 25 MG TAB: 25 | 30 days supply | Qty: 30 | Fill #1

## 2018-03-21 MED FILL — NITROFURANTOIN MONO-MCR 100: 100 | 10 days supply | Qty: 10 | Fill #0

## 2018-03-21 MED FILL — tiZANidine HCL 4 MG TABS: 4 | 3 days supply | Qty: 10 | Fill #1

## 2018-03-21 MED FILL — VENLAFAXINE HCL 100 MG TAB: 100 | 90 days supply | Qty: 180 | Fill #1

## 2018-03-27 MED FILL — busPIRone HCL 5 MG TABS: 5 | 90 days supply | Qty: 270 | Fill #0

## 2018-04-01 DIAGNOSIS — N3010 Interstitial cystitis (chronic) without hematuria: Secondary | ICD-10-CM | POA: Diagnosis not present

## 2018-04-08 MED FILL — PANTOPRAZOLE SOD DR 40 MG T: 40 | 90 days supply | Qty: 90 | Fill #0

## 2018-04-08 MED FILL — OSELTAMIVIR PHOSPHATE 75 MG: 75 | 10 days supply | Qty: 10 | Fill #0

## 2018-04-08 MED FILL — METOPROLOL TARTRATE 25 MG T: 25 | 90 days supply | Qty: 45 | Fill #0

## 2018-04-15 MED FILL — URO-MP CAPSULE: 118 | 10 days supply | Qty: 20 | Fill #1

## 2018-04-15 MED FILL — OSCIMIN SR 0.375 MG TABLET: 0.375 | 30 days supply | Qty: 30 | Fill #1

## 2018-04-22 DIAGNOSIS — N3010 Interstitial cystitis (chronic) without hematuria: Secondary | ICD-10-CM | POA: Diagnosis not present

## 2018-04-23 MED FILL — TOPIRAMATE 25 MG TAB: 25 | 30 days supply | Qty: 30 | Fill #2

## 2018-05-05 MED FILL — HYDROCHLOROTHIAZIDE 12.5 MG: 12.5 | 90 days supply | Qty: 90 | Fill #0 | Status: TO

## 2018-05-15 MED FILL — ESTRADIOL 10 MCG TABS: 10 | 28 days supply | Qty: 8 | Fill #1

## 2018-05-20 DIAGNOSIS — N3010 Interstitial cystitis (chronic) without hematuria: Secondary | ICD-10-CM | POA: Diagnosis not present

## 2018-05-26 MED FILL — TOPIRAMATE 25 MG TAB: 25 | 30 days supply | Qty: 30 | Fill #3

## 2018-05-28 ENCOUNTER — Other Ambulatory Visit: Payer: Self-pay | Admitting: Obstetrics and Gynecology

## 2018-05-28 DIAGNOSIS — Z1231 Encounter for screening mammogram for malignant neoplasm of breast: Secondary | ICD-10-CM

## 2018-06-09 MED FILL — PRAVASTATIN NA 40 MG TAB: 40 | 90 days supply | Qty: 90 | Fill #0 | Status: TO

## 2018-06-10 MED FILL — LOSARTAN POTASSIUM 50 MG TA: 50 | 90 days supply | Qty: 90 | Fill #1 | Status: TO

## 2018-06-19 ENCOUNTER — Other Ambulatory Visit: Payer: Self-pay | Admitting: Family

## 2018-06-19 DIAGNOSIS — B9789 Other viral agents as the cause of diseases classified elsewhere: Secondary | ICD-10-CM

## 2018-06-19 DIAGNOSIS — J329 Chronic sinusitis, unspecified: Principal | ICD-10-CM

## 2018-06-19 MED FILL — TOPIRAMATE 25 MG TAB: 25 | 30 days supply | Qty: 30 | Fill #4

## 2018-06-19 MED FILL — VENLAFAXINE HCL 100 MG TAB: 100 | 90 days supply | Qty: 180 | Fill #0

## 2018-06-27 ENCOUNTER — Ambulatory Visit: Payer: 59

## 2018-07-07 MED FILL — METOPROLOL TARTRATE 25 MG T: 25 | 90 days supply | Qty: 45 | Fill #0

## 2018-07-21 MED FILL — TOPIRAMATE 25 MG TABLET: 25 | 30 days supply | Qty: 30 | Fill #0

## 2018-07-28 MED FILL — HYDROCHLOROTHIAZIDE 12.5 MG: 12.5 | 90 days supply | Qty: 90 | Fill #0

## 2018-08-05 ENCOUNTER — Ambulatory Visit: Payer: 59

## 2018-08-14 MED FILL — busPIRone HCL 5 MG TABS: 5 | 90 days supply | Qty: 270 | Fill #0

## 2018-08-18 MED FILL — TOPIRAMATE 25 MG TABLET: 25 | 30 days supply | Qty: 30 | Fill #1

## 2018-09-05 MED FILL — PANTOPRAZOLE SOD DR 40 MG T: 40 | 90 days supply | Qty: 90 | Fill #0

## 2018-09-05 MED FILL — LOSARTAN POTASSIUM 50 MG TA: 50 | 30 days supply | Qty: 30 | Fill #0

## 2018-09-05 MED FILL — PRAVASTATIN NA 40 MG TAB: 40 | 90 days supply | Qty: 90 | Fill #0

## 2018-09-18 ENCOUNTER — Other Ambulatory Visit: Payer: Self-pay

## 2018-09-18 ENCOUNTER — Ambulatory Visit
Admission: RE | Admit: 2018-09-18 | Discharge: 2018-09-18 | Disposition: A | Discharge status: Home / Self Care | Payer: 59 | Source: Ambulatory Visit | Attending: Obstetrics and Gynecology | Admitting: Obstetrics and Gynecology

## 2018-09-18 DIAGNOSIS — Z1231 Encounter for screening mammogram for malignant neoplasm of breast: Secondary | ICD-10-CM | POA: Diagnosis not present

## 2018-09-23 DIAGNOSIS — F329 Major depressive disorder, single episode, unspecified: Secondary | ICD-10-CM | POA: Diagnosis not present

## 2018-09-23 DIAGNOSIS — G43909 Migraine, unspecified, not intractable, without status migrainosus: Secondary | ICD-10-CM | POA: Diagnosis not present

## 2018-09-23 DIAGNOSIS — I10 Essential (primary) hypertension: Secondary | ICD-10-CM | POA: Diagnosis not present

## 2018-09-23 DIAGNOSIS — J309 Allergic rhinitis, unspecified: Secondary | ICD-10-CM | POA: Diagnosis not present

## 2018-09-23 DIAGNOSIS — F419 Anxiety disorder, unspecified: Secondary | ICD-10-CM | POA: Diagnosis not present

## 2018-09-25 MED FILL — VENLAFAXINE HCL 100 MG TAB: 100 | 45 days supply | Qty: 90 | Fill #0

## 2018-09-25 MED FILL — URO-MP CAPSULE: 118 | 10 days supply | Qty: 20 | Fill #2

## 2018-09-25 MED FILL — TOPIRAMATE 25 MG TAB: 25 | 90 days supply | Qty: 90 | Fill #0

## 2018-09-25 MED FILL — METOPROLOL TARTRATE 25 MG T: 25 | 90 days supply | Qty: 45 | Fill #1

## 2018-09-30 MED FILL — LOSARTAN POTASSIUM 50 MG TA: 50 | 30 days supply | Qty: 30 | Fill #1

## 2018-10-08 DIAGNOSIS — B372 Candidiasis of skin and nail: Secondary | ICD-10-CM | POA: Diagnosis not present

## 2018-10-08 DIAGNOSIS — L821 Other seborrheic keratosis: Secondary | ICD-10-CM | POA: Diagnosis not present

## 2018-10-08 DIAGNOSIS — D225 Melanocytic nevi of trunk: Secondary | ICD-10-CM | POA: Diagnosis not present

## 2018-10-08 DIAGNOSIS — L812 Freckles: Secondary | ICD-10-CM | POA: Diagnosis not present

## 2018-10-08 MED FILL — MOMETASONE FUROATE 0.1% CRM: 0.1 | 14 days supply | Qty: 45 | Fill #0

## 2018-10-08 MED FILL — FLUCONAZOLE 200 MG TABLET: 200 | 7 days supply | Qty: 7 | Fill #0

## 2018-11-04 MED FILL — HYDROCHLOROTHIAZIDE 12.5 MG: 12.5 | 90 days supply | Qty: 90 | Fill #1

## 2018-11-10 MED FILL — VENLAFAXINE HCL 100 MG TABS: 100 | 90 days supply | Qty: 180 | Fill #0

## 2018-11-17 MED FILL — LOSARTAN POTASSIUM 50 MG TA: 50 | 30 days supply | Qty: 30 | Fill #2

## 2018-11-26 MED FILL — LOSARTAN POTASSIUM 50 MG TA: 50 | 30 days supply | Qty: 30 | Fill #2

## 2018-12-09 DIAGNOSIS — N3010 Interstitial cystitis (chronic) without hematuria: Secondary | ICD-10-CM | POA: Diagnosis not present

## 2018-12-23 DIAGNOSIS — N3010 Interstitial cystitis (chronic) without hematuria: Secondary | ICD-10-CM | POA: Diagnosis not present

## 2018-12-29 DIAGNOSIS — D013 Carcinoma in situ of anus and anal canal: Secondary | ICD-10-CM | POA: Diagnosis not present

## 2019-01-08 ENCOUNTER — Other Ambulatory Visit: Payer: Self-pay | Admitting: Internal Medicine

## 2019-01-08 DIAGNOSIS — R1084 Generalized abdominal pain: Secondary | ICD-10-CM

## 2019-01-08 DIAGNOSIS — K582 Mixed irritable bowel syndrome: Secondary | ICD-10-CM | POA: Diagnosis not present

## 2019-01-14 ENCOUNTER — Ambulatory Visit
Admission: RE | Admit: 2019-01-14 | Discharge: 2019-01-14 | Disposition: A | Discharge status: Home / Self Care | Payer: 59 | Source: Ambulatory Visit | Attending: Internal Medicine | Admitting: Internal Medicine

## 2019-01-14 DIAGNOSIS — R1084 Generalized abdominal pain: Secondary | ICD-10-CM

## 2019-01-14 DIAGNOSIS — R109 Unspecified abdominal pain: Secondary | ICD-10-CM | POA: Diagnosis not present

## 2019-01-15 NOTE — Progress Notes (Signed)
CARDIOLOGY OFFICE NOTE  Date:  01/19/2019    [name] Date of Birth: 1955-08-17 Medical Record [id number]  PCP:  Leeroy Cha, MD  Cardiologist:  Starr Lake  Chief Complaint  Patient presents with  . Follow-up    1 year check    History of Present Illness: April Rodriguez is a 63 y.o. female who presents today for a one year check.  Seen for Dr. Irish Lack. She basically follows with me He also sees her sister who has known CAD.   She has hadpriorcardiac cath back in 2034for chest pain - this showed just very mild nonobstructive CAD. FH quite + for CAD- sister with prior stents. Other issues include HLD, HTN and GERD.   I have followed her since - she has had some intermittent chest pain related to stress with dealing with elderly parents. Father with dementia and mother with prior stroke. They ended up being placed in long term care facility and have subsequently passed away. Last seen a year ago - no real cardiac issues noted.   The patient does not have symptoms concerning for COVID-19 infection (fever, chills, cough, or new shortness of breath).   Comes in today. Here alone. She notes lots of stress. Her parents have passed. Daughter working in a long term care facility with COVID patients. She has had some intermittent chest heaviness - not really exertional - she is not sure. Seems worse with stress at times. Rare dizziness. BP is good. Some occasional palpitations - nothing that sounds too bothersome - she likes the low dose of Metoprolol. Her labs were just checked with her PCP about a week ago.   Past Medical History:  Diagnosis Date  . AIN grade III   . Allergic rhinitis   . Anxiety   . Chronic constipation   . Depression   . Family history of adverse reaction to anesthesia    sister gets nauseated  . Frequency of urination   . GERD (gastroesophageal reflux disease)   . Headache    migraines  . History of hiatal hernia   .  Hypertension   . IBS (irritable bowel syndrome)   . Rash    UNDER BREAST    Past Surgical History:  Procedure Laterality Date  . [place] BUNIONECTOMY RIGHT FOOT  03-07-2000  . BREAST BIOPSY    . CARDIAC CATHETERIZATION N/A 10/21/2015   Procedure: Left Heart Cath and Coronary Angiography;  Surgeon: Jettie Booze, MD;  Location: [facility];  Service: Cardiovascular;  Laterality: N/A;  . CATARACT EXTRACTION W/ INTRAOCULAR LENS  IMPLANT, BILATERAL  2011  . COLONOSCOPY  12-15-2009  . COLONOSCOPY N/A 04/09/2017   Procedure: COLONOSCOPY;  Surgeon: Ronnette Juniper, MD;  [facility];  Service: Gastroenterology;  Laterality: N/A;  . COLONOSCOPY WITH PROPOFOL N/A 03/08/2016   Procedure: COLONOSCOPY WITH PROPOFOL;  Surgeon: Garlan Fair, MD;  [facility];  Service: Endoscopy;  Laterality: N/A;  . CYSTO/  BLADDER BX/  HYDRODISTENTION  01-30-2006  . ESOPHAGOGASTRODUODENOSCOPY N/A 04/09/2017   Procedure: ESOPHAGOGASTRODUODENOSCOPY (EGD);  Surgeon: Ronnette Juniper, MD;  Location: [facility];  Service: Gastroenterology;  Laterality: N/A;  . [place]  . LEFT BREAST BX  1980's   BENIGN  . RECTAL EXAM UNDER ANESTHESIA N/A 12/10/2014   Procedure: ANAL EXAM UNDER ANESTHESIA  EXCISIONAL BIOPSY OF PERIANAL NEOPLASM;  Surgeon: [name], MD;  Location: [facility];  Service: General;  Laterality: N/A;  . TUBAL  LIGATION  1980's     Medications: Current Meds  Medication Sig  . aspirin-acetaminophen-caffeine (EXCEDRIN MIGRAINE) 250-250-65 MG tablet Take 1 tablet by mouth daily as needed for headache.  . busPIRone (BUSPAR) 5 MG tablet Take 5 mg by mouth 3 (three) times daily.  . clotrimazole-betamethasone (LOTRISONE) cream Apply 1 application topically as needed (rash).   Marland Kitchen docusate sodium (COLACE) 100 MG capsule Take 100 mg by mouth 2 (two) times daily as needed for mild constipation.  Marland Kitchen FIBER PO Take 1 capsule by mouth daily as needed (constipation).   . fluticasone (FLONASE) 50 MCG/ACT nasal spray Place 2 sprays daily into both nostrils.  . hydrochlorothiazide (MICROZIDE) 12.5 MG capsule Take 12.5 mg by mouth daily.  Marland Kitchen Hyoscyamine Sulfate 0.375 MG TBCR Take 0.375 mg by mouth as needed (STOMACH SPASMS).   Marland Kitchen ibuprofen (ADVIL,MOTRIN) 200 MG tablet Take 200-400 mg by mouth daily as needed for headache or moderate pain.  Marland Kitchen levocetirizine (XYZAL) 5 MG tablet Take 5 mg by mouth daily as needed for allergies. If Claritin doesn't work  . loratadine (CLARITIN) 10 MG tablet Take 10 mg by mouth daily as needed for allergies.  Marland Kitchen losartan (COZAAR) 50 MG tablet Take 50 mg by mouth daily.  . meclizine (ANTIVERT) 25 MG tablet Take 25 mg by mouth as needed (DIZZINESS).   . Melatonin 5 MG TABS Take 5 mg by mouth at bedtime as needed (sleep).  . metoprolol tartrate (LOPRESSOR) 25 MG tablet Take 12.5 mg by mouth daily.   Marland Kitchen MINERAL OIL PO Take by mouth as needed (2 TABLESPOONS  AS FOR CONSTIPATION).  Marland Kitchen ondansetron (ZOFRAN) 4 MG tablet Take 4 mg by mouth 2 (two) times daily as needed for nausea or vomiting.  . pantoprazole (PROTONIX) 40 MG tablet Take 40 mg by mouth daily.   . pravastatin (PRAVACHOL) 40 MG tablet Take 40 mg by mouth daily.   Marland Kitchen tiZANidine (ZANAFLEX) 4 MG tablet Take 4 mg by mouth 3 (three) times daily as needed for muscle spasms.  Marland Kitchen topiramate (TOPAMAX) 25 MG tablet Take 25 mg by mouth daily.  Marland Kitchen venlafaxine (EFFEXOR) 100 MG tablet Take 100 mg by mouth 2 (two) times daily.     Allergies: Allergies  Allergen Reactions  . Erythromycin Diarrhea    GI upset    Social History: The patient  reports that she has never smoked. She has never used smokeless tobacco. She reports that she does not drink alcohol or use drugs.   Family History: The patient's family history includes Aneurysm in her sister; Breast cancer (age of onset: 54) in her mother; Emphysema in her sister; Heart attack in her mother; Lung cancer in her sister; Prostate cancer in  her father; Rheum arthritis in her mother; Stroke in her brother and mother.   Review of Systems: Please see the history of present illness.   All other systems are reviewed and negative.   Physical Exam: VS:  BP 114/70   Pulse 81   Wt 126 lb 1.9 oz (57.2 kg)   SpO2 98%   BMI 22.34 kg/m  .  BMI Body mass index is 22.34 kg/m.  Wt Readings from Last 3 Encounters:  01/19/19 126 lb 1.9 oz (57.2 kg)  01/20/18 129 lb 12.8 oz (58.9 kg)  12/16/17 129 lb (58.5 kg)    General: Pleasant. Well developed, well nourished and in no acute distress.   HEENT: Normal.  Neck: Supple, no JVD, carotid bruits, or masses noted.  Cardiac: Regular rate and  rhythm. No murmurs, rubs, or gallops. No edema.  Respiratory:  Lungs are clear to auscultation bilaterally with normal work of breathing.  GI: Soft and nontender.  MS: No deformity or atrophy. Gait and ROM intact.  Skin: Warm and dry. Color is normal.  Neuro:  Strength and sensation are intact and no gross focal deficits noted.  Psych: Alert, appropriate and with normal affect.   LABORATORY DATA:  EKG:  EKG is ordered today. This demonstrates NSR with RBBB and LAFB. HR is 81. Basically unchanged.   Lab Results  Component Value Date   WBC 4.5 10/15/2015   HGB 13.5 10/15/2015   HCT 40.6 10/15/2015   PLT 222 10/15/2015   GLUCOSE 107 (H) 10/15/2015   NA 141 10/15/2015   K 3.6 10/15/2015   CL 107 10/15/2015   CREATININE 0.71 10/15/2015   BUN 8 10/15/2015   CO2 26 10/15/2015   INR 1.11 10/21/2015       BNP (last 3 results) No results for input(s): BNP in the last 8760 hours.  ProBNP (last 3 results) No results for input(s): PROBNP in the last 8760 hours.   Other Studies Reviewed Today:  Cardiac Cath Conclusion from 10/2015   Ost RCA to Prox RCA lesion, 10% stenosed.  The left ventricular systolic function is normal.  No significant cortonary artery disease.  Continue aggressive preventive therapy      Assessment/Plan:  1. Chest pain - she has known mild non obstructive CAD from her cath in 2017 - sister with extensive CAD and prior stents. Multiple CV risk factors. Will arrange for cardiac CT. BMET today. Needs aggressive risk factor modification.   2. HTN - BP is good.   3. Palpitations - stable with low dose beta blocker therapy.   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   5. HLD - on statin - has had her labs just checked by PCP - she will try to get [place] a copy.   6. +FH for CAD - see above.   7. Situational stress - still plays a big role.   8. COVID-19 Education: The signs and symptoms of COVID-19 were discussed with the patient and how to seek care for testing (follow up with PCP or arrange E-visit).  The importance of social distancing, staying at home, hand hygiene and wearing a mask when out in public were discussed today.  Current medicines are reviewed with the patient today.  The patient does not have concerns regarding medicines other than what has been noted above.  The following changes have been made:  See above.  Labs/ tests ordered today include:    Orders Placed This Encounter  Procedures  . CT CORONARY MORPH W/CTA COR W/SCORE W/CA W/CM &/OR WO/CM  . CT CORONARY FRACTIONAL FLOW RESERVE DATA PREP  . CT CORONARY FRACTIONAL FLOW RESERVE FLUID ANALYSIS  . Basic metabolic panel  . EKG 12-Lead     Disposition:   FU with me in 9 months.   Patient is agreeable to this plan and will call if any problems develop in the interim.   SignedTruitt Merle, NP  01/19/2019 8:43 AM  [facility] [address] [place], [place]  [address] Phone: [phone] Fax: [phone]

## 2019-01-19 ENCOUNTER — Other Ambulatory Visit: Payer: Self-pay

## 2019-01-19 ENCOUNTER — Ambulatory Visit (INDEPENDENT_AMBULATORY_CARE_PROVIDER_SITE_OTHER): Payer: 59 | Admitting: Nurse Practitioner

## 2019-01-19 ENCOUNTER — Encounter: Payer: Self-pay | Admitting: Nurse Practitioner

## 2019-01-19 VITALS — BP 114/70 | HR 81 | Wt 126.1 lb

## 2019-01-19 DIAGNOSIS — E785 Hyperlipidemia, unspecified: Secondary | ICD-10-CM

## 2019-01-19 DIAGNOSIS — R000 Tachycardia, unspecified: Secondary | ICD-10-CM | POA: Diagnosis not present

## 2019-01-19 DIAGNOSIS — I10 Essential (primary) hypertension: Secondary | ICD-10-CM | POA: Diagnosis not present

## 2019-01-19 DIAGNOSIS — I259 Chronic ischemic heart disease, unspecified: Secondary | ICD-10-CM

## 2019-01-19 DIAGNOSIS — Z7189 Other specified counseling: Secondary | ICD-10-CM | POA: Diagnosis not present

## 2019-01-19 LAB — BASIC METABOLIC PANEL
BUN/Creatinine Ratio: 15 (ref 12–28)
BUN: 11 mg/dL (ref 8–27)
CO2: 25 mmol/L (ref 20–29)
Calcium: 9.8 mg/dL (ref 8.7–10.3)
Chloride: 99 mmol/L (ref 96–106)
Creatinine, Ser: 0.71 mg/dL (ref 0.57–1.00)
GFR calc Af Amer: 105 mL/min/{1.73_m2} (ref >59)
GFR calc non Af Amer: 91 mL/min/{1.73_m2} (ref >59)
Glucose: 95 mg/dL (ref 65–99)
Potassium: 3.5 mmol/L (ref 3.5–5.2)
Sodium: 140 mmol/L (ref 134–144)

## 2019-01-19 NOTE — Patient Instructions (Addendum)
After Visit Summary:  We will be checking the following labs today - BMET   Medication Instructions:    Continue with your current medicines.    If you need a refill on your cardiac medications before your next appointment, please call your pharmacy.     Testing/Procedures To Be Arranged:  Cardiac CT  Follow-Up:   See me in 9 months    At [facility], you and your health needs are our priority.  As part of our continuing mission to provide you with exceptional heart care, we have created designated Provider Care Teams.  These Care Teams include your primary Cardiologist (physician) and Advanced Practice Providers (APPs -  Physician Assistants and Nurse Practitioners) who all work together to provide you with the care you need, when you need it.  Special Instructions:  . Stay safe, stay home, wash your hands for at least 20 seconds and wear a mask when out in public.  . It was good to talk with you today.   Your cardiac CT will be scheduled at:   [facility] [address] [place], [place] [address] [phone]  If scheduled at [facility], please arrive at the [facility] main entrance of [facility] 30-45 minutes prior to test start time. Proceed to the [facility] Radiology Department (first floor) to check-in and test prep.  Please follow these instructions carefully (unless otherwise directed):   On the Night Before the Test: . Be sure to Drink plenty of water. . Do not consume any caffeinated/decaffeinated beverages or chocolate 12 hours prior to your test. . Do not take any antihistamines 12 hours prior to your test. .   On the Day of the Test: . Drink plenty of water. Do not drink any water within one hour of the test. . Do not eat any food 4 hours prior to the test. . You may take your regular medications prior to the test.  . Take metoprolol (Lopressor) - total of 50 mg (2 pills) two hours prior to test - this is to help  get your heart rate slower for the study. Marland Kitchen FEMALES- please wear underwire-free bra if available      After the Test: . Drink plenty of water. . After receiving IV contrast, you may experience a mild flushed feeling. This is normal. . On occasion, you may experience a mild rash up to 24 hours after the test. This is not dangerous. If this occurs, you can take Benadryl 25 mg and increase your fluid intake. . If you experience trouble breathing, this can be serious. If it is severe call 911 IMMEDIATELY. If it is mild, please call our office.  Please contact the cardiac imaging nurse navigator should you have any questions/concerns Marchia Bond, RN Navigator Cardiac Imaging [facility] [phone] Office      Call the [facility] office at [phone] if you have any questions, problems or concerns.

## 2019-01-22 ENCOUNTER — Other Ambulatory Visit: Payer: Self-pay

## 2019-01-22 DIAGNOSIS — Z20822 Contact with and (suspected) exposure to covid-19: Secondary | ICD-10-CM

## 2019-01-24 LAB — NOVEL CORONAVIRUS, NAA: SARS-CoV-2, NAA: NOT DETECTED

## 2019-02-03 DIAGNOSIS — G43909 Migraine, unspecified, not intractable, without status migrainosus: Secondary | ICD-10-CM | POA: Diagnosis not present

## 2019-02-03 DIAGNOSIS — Z0001 Encounter for general adult medical examination with abnormal findings: Secondary | ICD-10-CM | POA: Diagnosis not present

## 2019-02-03 DIAGNOSIS — Z1231 Encounter for screening mammogram for malignant neoplasm of breast: Secondary | ICD-10-CM | POA: Diagnosis not present

## 2019-02-03 DIAGNOSIS — Z13220 Encounter for screening for lipoid disorders: Secondary | ICD-10-CM | POA: Diagnosis not present

## 2019-02-03 DIAGNOSIS — F419 Anxiety disorder, unspecified: Secondary | ICD-10-CM | POA: Diagnosis not present

## 2019-02-03 DIAGNOSIS — Z1211 Encounter for screening for malignant neoplasm of colon: Secondary | ICD-10-CM | POA: Diagnosis not present

## 2019-02-03 DIAGNOSIS — I10 Essential (primary) hypertension: Secondary | ICD-10-CM | POA: Diagnosis not present

## 2019-02-03 DIAGNOSIS — F3341 Major depressive disorder, recurrent, in partial remission: Secondary | ICD-10-CM | POA: Diagnosis not present

## 2019-02-24 ENCOUNTER — Telehealth (HOSPITAL_COMMUNITY): Payer: Self-pay | Admitting: Emergency Medicine

## 2019-02-24 NOTE — Telephone Encounter (Signed)
Reaching out to patient to offer assistance regarding upcoming cardiac imaging study; pt verbalizes understanding of appt date/time, parking situation and where to check in, pre-test NPO status and medications ordered, and verified current allergies; name and call back number provided for further questions should they arise Marchia Bond RN [facility] [phone] office [phone] cell  Pt verbalized understanding to take 50mg  metoprolol 2 hr prior to test rather than usual 12.5mg  daily

## 2019-02-25 ENCOUNTER — Other Ambulatory Visit: Payer: Self-pay

## 2019-02-25 ENCOUNTER — Ambulatory Visit (HOSPITAL_COMMUNITY)
Admission: RE | Admit: 2019-02-25 | Discharge: 2019-02-25 | Disposition: A | Discharge status: Home / Self Care | Payer: 59 | Source: Ambulatory Visit | Attending: Nurse Practitioner | Admitting: Nurse Practitioner

## 2019-02-25 DIAGNOSIS — I259 Chronic ischemic heart disease, unspecified: Secondary | ICD-10-CM | POA: Insufficient documentation

## 2019-02-25 MED ORDER — IOHEXOL 350 MG/ML SOLN
80.0000 mL | Freq: Once | INTRAVENOUS | Status: AC | PRN
  Administered 2019-02-25: 80 mL via INTRAVENOUS

## 2019-02-25 MED ORDER — NITROGLYCERIN 0.4 MG SL SUBL
0.8000 mg | SUBLINGUAL_TABLET | Freq: Once | SUBLINGUAL | Status: AC
  Administered 2019-02-25: 08:00:00 0.8 mg via SUBLINGUAL

## 2019-02-25 MED ORDER — NITROGLYCERIN 0.4 MG SL SUBL
SUBLINGUAL_TABLET | SUBLINGUAL | Status: AC
  Administered 2019-02-25: 08:00:00 0.8 mg via SUBLINGUAL
  Filled 2019-02-25: qty 2

## 2019-02-27 ENCOUNTER — Ambulatory Visit (HOSPITAL_COMMUNITY): Payer: 59

## 2019-03-02 ENCOUNTER — Other Ambulatory Visit: Payer: Self-pay | Admitting: *Deleted

## 2019-03-02 MED ORDER — ASPIRIN EC 81 MG PO TBEC: 81.0000 mg | DELAYED_RELEASE_TABLET | Freq: Every day | ORAL | 3 refills | Status: AC

## 2019-03-10 DIAGNOSIS — Z01419 Encounter for gynecological examination (general) (routine) without abnormal findings: Secondary | ICD-10-CM | POA: Diagnosis not present

## 2019-03-10 DIAGNOSIS — Z6822 Body mass index (BMI) 22.0-22.9, adult: Secondary | ICD-10-CM | POA: Diagnosis not present

## 2019-03-17 DIAGNOSIS — H52203 Unspecified astigmatism, bilateral: Secondary | ICD-10-CM | POA: Diagnosis not present

## 2019-03-26 DIAGNOSIS — M25561 Pain in right knee: Secondary | ICD-10-CM | POA: Diagnosis not present

## 2019-03-26 DIAGNOSIS — M25562 Pain in left knee: Secondary | ICD-10-CM | POA: Diagnosis not present

## 2019-05-19 ENCOUNTER — Other Ambulatory Visit (HOSPITAL_COMMUNITY): Payer: Self-pay | Admitting: Internal Medicine

## 2019-06-03 DIAGNOSIS — J029 Acute pharyngitis, unspecified: Secondary | ICD-10-CM | POA: Diagnosis not present

## 2019-06-03 DIAGNOSIS — M85859 Other specified disorders of bone density and structure, unspecified thigh: Secondary | ICD-10-CM | POA: Diagnosis not present

## 2019-06-03 DIAGNOSIS — J301 Allergic rhinitis due to pollen: Secondary | ICD-10-CM | POA: Diagnosis not present

## 2019-06-04 ENCOUNTER — Other Ambulatory Visit: Payer: Self-pay | Admitting: Internal Medicine

## 2019-06-04 DIAGNOSIS — M8580 Other specified disorders of bone density and structure, unspecified site: Secondary | ICD-10-CM

## 2019-06-25 DIAGNOSIS — R300 Dysuria: Secondary | ICD-10-CM | POA: Diagnosis not present

## 2019-06-25 DIAGNOSIS — R197 Diarrhea, unspecified: Secondary | ICD-10-CM | POA: Diagnosis not present

## 2019-06-25 DIAGNOSIS — K5909 Other constipation: Secondary | ICD-10-CM | POA: Diagnosis not present

## 2019-07-14 ENCOUNTER — Other Ambulatory Visit: Payer: Self-pay

## 2019-07-14 ENCOUNTER — Ambulatory Visit
Admission: RE | Admit: 2019-07-14 | Discharge: 2019-07-14 | Disposition: A | Discharge status: Home / Self Care | Payer: 59 | Source: Ambulatory Visit | Attending: Internal Medicine | Admitting: Internal Medicine

## 2019-07-14 DIAGNOSIS — M8580 Other specified disorders of bone density and structure, unspecified site: Secondary | ICD-10-CM

## 2019-07-14 DIAGNOSIS — Z780 Asymptomatic menopausal state: Secondary | ICD-10-CM | POA: Diagnosis not present

## 2019-07-14 DIAGNOSIS — M8589 Other specified disorders of bone density and structure, multiple sites: Secondary | ICD-10-CM | POA: Diagnosis not present

## 2019-08-11 ENCOUNTER — Other Ambulatory Visit (HOSPITAL_COMMUNITY): Payer: Self-pay | Admitting: Internal Medicine

## 2019-08-11 DIAGNOSIS — F419 Anxiety disorder, unspecified: Secondary | ICD-10-CM | POA: Diagnosis not present

## 2019-08-11 DIAGNOSIS — K589 Irritable bowel syndrome without diarrhea: Secondary | ICD-10-CM | POA: Diagnosis not present

## 2019-08-11 DIAGNOSIS — I10 Essential (primary) hypertension: Secondary | ICD-10-CM | POA: Diagnosis not present

## 2019-08-11 DIAGNOSIS — M85859 Other specified disorders of bone density and structure, unspecified thigh: Secondary | ICD-10-CM | POA: Diagnosis not present

## 2019-08-11 DIAGNOSIS — F3341 Major depressive disorder, recurrent, in partial remission: Secondary | ICD-10-CM | POA: Diagnosis not present

## 2019-08-11 DIAGNOSIS — G43909 Migraine, unspecified, not intractable, without status migrainosus: Secondary | ICD-10-CM | POA: Diagnosis not present

## 2019-08-18 ENCOUNTER — Other Ambulatory Visit: Payer: Self-pay | Admitting: Obstetrics and Gynecology

## 2019-08-18 DIAGNOSIS — Z1231 Encounter for screening mammogram for malignant neoplasm of breast: Secondary | ICD-10-CM

## 2019-08-25 DIAGNOSIS — N3010 Interstitial cystitis (chronic) without hematuria: Secondary | ICD-10-CM | POA: Diagnosis not present

## 2019-09-23 ENCOUNTER — Ambulatory Visit
Admission: RE | Admit: 2019-09-23 | Discharge: 2019-09-23 | Disposition: A | Discharge status: Home / Self Care | Payer: 59 | Source: Ambulatory Visit | Attending: Obstetrics and Gynecology | Admitting: Obstetrics and Gynecology

## 2019-09-23 ENCOUNTER — Other Ambulatory Visit: Payer: Self-pay

## 2019-09-23 DIAGNOSIS — Z1231 Encounter for screening mammogram for malignant neoplasm of breast: Secondary | ICD-10-CM

## 2019-09-29 NOTE — Progress Notes (Signed)
CARDIOLOGY OFFICE NOTE  Date:  10/12/2019    [name] Date of Birth: July 01, 1955 Medical Record #[id number]  PCP:  Leeroy Cha, MD  Cardiologist:  Starr Lake    Chief Complaint  Patient presents with  . Follow-up    Seen for Dr. Irish Lack    History of Present Illness: April Rodriguez is a 64 y.o. female who presents today for a follow up visit.  Seen for Dr. Irish Lack.She basically follows with me He also sees her sister who has known CAD.  She has hadpriorcardiac cath back in 2014for chest pain -this showed just very mild nonobstructive CAD. FH quite + for CAD- sister with prior stents. Other issues include HLD, HTN and GERD.  I have followed her since - she has hadsome intermittentchest pain related to stress with dealing with elderly parents. Father had dementia and mother with prior stroke. They ended up being placed in long term care facility and both have subsequently passed away. Last seen in 02/04/2023. BP ok. Lots of stress with COVID issues. Palpitations better with beta blocker therapy. Cardiac CT was done and was reassuring.   Comes in today. Here alone. BP looks good. Some occasional "head feeling funny" - but BP was ok. Has had some shooting chest pain - like an "electrical charge". Very fleeting. Nothing exertional or worrisome. No palpitations. Taking the Metoprolol just in the evening with good results.  Needs labs today. She is fasting today.   Past Medical History:  Diagnosis Date  . AIN grade III   . Allergic rhinitis   . Anxiety   . Chronic constipation   . Depression   . Family history of adverse reaction to anesthesia    sister gets nauseated  . Frequency of urination   . GERD (gastroesophageal reflux disease)   . Headache    migraines  . History of hiatal hernia   . Hypertension   . IBS (irritable bowel syndrome)   . Rash    UNDER BREAST    Past Surgical History:  Procedure Laterality Date  . [place]  BUNIONECTOMY RIGHT FOOT  03-07-2000  . BREAST BIOPSY    . CARDIAC CATHETERIZATION N/A 10/21/2015   Procedure: Left Heart Cath and Coronary Angiography;  Surgeon: Jettie Booze, MD;  Location: [facility];  Service: Cardiovascular;  Laterality: N/A;  . CATARACT EXTRACTION W/ INTRAOCULAR LENS  IMPLANT, BILATERAL  2011  . COLONOSCOPY  12-15-2009  . COLONOSCOPY N/A 04/09/2017   Procedure: COLONOSCOPY;  Surgeon: Ronnette Juniper, MD;  Location: [facility];  Service: Gastroenterology;  Laterality: N/A;  . COLONOSCOPY WITH PROPOFOL N/A 03/08/2016   Procedure: COLONOSCOPY WITH PROPOFOL;  Surgeon: Garlan Fair, MD;  Location: [facility];  Service: Endoscopy;  Laterality: N/A;  . CYSTO/  BLADDER BX/  HYDRODISTENTION  01-30-2006  . ESOPHAGOGASTRODUODENOSCOPY N/A 04/09/2017   Procedure: ESOPHAGOGASTRODUODENOSCOPY (EGD);  Surgeon: Ronnette Juniper, MD;  Location: [facility];  Service: Gastroenterology;  Laterality: N/A;  . [place]  . LEFT BREAST BX  1980's   BENIGN  . RECTAL EXAM UNDER ANESTHESIA N/A 12/10/2014   Procedure: ANAL EXAM UNDER ANESTHESIA  EXCISIONAL BIOPSY OF PERIANAL NEOPLASM;  Surgeon: [name], MD;  Location: [place] [facility];  Service: General;  Laterality: N/A;  . TUBAL LIGATION  1980's     Medications: Current Meds  Medication Sig  . busPIRone (BUSPAR) 5 MG tablet Take 2.5 mg by mouth daily.  . clotrimazole-betamethasone (LOTRISONE) cream Apply 1 application  topically as needed (rash).   Marland Kitchen docusate sodium (COLACE) 100 MG capsule Take 100 mg by mouth 2 (two) times daily as needed for mild constipation.  Marland Kitchen FIBER PO Take 1 capsule by mouth daily as needed (constipation).  . fluticasone (FLONASE) 50 MCG/ACT nasal spray Place 2 sprays daily into both nostrils.  . hydrochlorothiazide (MICROZIDE) 12.5 MG capsule Take 12.5 mg by mouth daily.  Marland Kitchen Hyoscyamine Sulfate 0.375 MG TBCR Take 0.375 mg by mouth as needed (STOMACH SPASMS).   Marland Kitchen ibuprofen  (ADVIL,MOTRIN) 200 MG tablet Take 200-400 mg by mouth daily as needed for headache or moderate pain.  Marland Kitchen levocetirizine (XYZAL) 5 MG tablet Take 5 mg by mouth daily as needed for allergies. If Claritin doesn't work  . loratadine (CLARITIN) 10 MG tablet Take 10 mg by mouth daily as needed for allergies.  Marland Kitchen losartan (COZAAR) 50 MG tablet Take 50 mg by mouth daily.  . meclizine (ANTIVERT) 25 MG tablet Take 25 mg by mouth as needed (DIZZINESS).   . Melatonin 5 MG TABS Take 5 mg by mouth at bedtime as needed (sleep).  . metoprolol tartrate (LOPRESSOR) 25 MG tablet Take 12.5 mg by mouth every evening.  Marland Kitchen MINERAL OIL PO Take by mouth as needed (2 TABLESPOONS  AS FOR CONSTIPATION).  Marland Kitchen ondansetron (ZOFRAN) 4 MG tablet Take 4 mg by mouth 2 (two) times daily as needed for nausea or vomiting.  . pantoprazole (PROTONIX) 40 MG tablet Take 40 mg by mouth daily.   . pravastatin (PRAVACHOL) 40 MG tablet Take 40 mg by mouth daily.   Marland Kitchen tiZANidine (ZANAFLEX) 4 MG tablet Take 4 mg by mouth 3 (three) times daily as needed for muscle spasms.  Marland Kitchen topiramate (TOPAMAX) 25 MG tablet Take 25 mg by mouth daily.  Marland Kitchen venlafaxine (EFFEXOR) 100 MG tablet Take 100 mg by mouth 2 (two) times daily.     Allergies: Allergies  Allergen Reactions  . Erythromycin Diarrhea    GI upset    Social History: The patient  reports that she has never smoked. She has never used smokeless tobacco. She reports that she does not drink alcohol and does not use drugs.   Family History: The patient's family history includes Aneurysm in her sister; Breast cancer (age of onset: 13) in her mother; Emphysema in her sister; Heart attack in her mother; Lung cancer in her sister; Prostate cancer in her father; Rheum arthritis in her mother; Stroke in her brother and mother.   Review of Systems: Please see the history of present illness.   All other systems are reviewed and negative.   Physical Exam: VS:  BP 124/78 (BP Location: Left Arm, Patient  Position: Sitting, Cuff Size: Normal)   Pulse 70   Ht 5\' 3"  (1.6 m)   Wt 130 lb (59 kg)   SpO2 98%   BMI 23.03 kg/m  .  BMI Body mass index is 23.03 kg/m.  Wt Readings from Last 3 Encounters:  10/12/19 130 lb (59 kg)  01/19/19 126 lb 1.9 oz (57.2 kg)  01/20/18 129 lb 12.8 oz (58.9 kg)    General: Alert and in no acute distress.   Cardiac: Regular rate and rhythm. No murmurs, rubs, or gallops. No edema.  Respiratory:  Lungs are clear to auscultation bilaterally with normal work of breathing.  GI: Soft and nontender.  MS: No deformity or atrophy. Gait and ROM intact.  Skin: Warm and dry. Color is normal.  Neuro:  Strength and sensation are intact and no  gross focal deficits noted.  Psych: Alert, appropriate and with normal affect.   LABORATORY DATA:  EKG:  EKG is not ordered today.    Lab Results  Component Value Date   WBC 4.5 10/15/2015   HGB 13.5 10/15/2015   HCT 40.6 10/15/2015   PLT 222 10/15/2015   GLUCOSE 95 01/19/2019   NA 140 01/19/2019   K 3.5 01/19/2019   CL 99 01/19/2019   CREATININE 0.71 01/19/2019   BUN 11 01/19/2019   CO2 25 01/19/2019   INR 1.11 10/21/2015       BNP (last 3 results) No results for input(s): BNP in the last 8760 hours.  ProBNP (last 3 results) No results for input(s): PROBNP in the last 8760 hours.   Other Studies Reviewed Today:  CARDIAC CT IMPRESSION 02/2019: 1. Coronary artery calcium score 40 Agatston units. This places the patient in the 75th percentile for age and gender, suggesting intermediate risk for future cardiac events.  2.  Nonobstructive CAD.  Dalton Mclean   Electronically Signed   By: Loralie Champagne M.D.   On: 02/25/2019 15:55  Cardiac Cath Conclusion from 10/2015   Ost RCA to Prox RCA lesion, 10% stenosed.  The left ventricular systolic function is normal.  No significant cortonary artery disease.  Continue aggressive preventive therapy     Assessment/Plan:  1. Mild non  obstructive CAD - prior cath in 2017 - reassuring CT last fall - needs aggressive CV risk factor modification. Restart baby aspirin.   2. HTN - BP looks great on her ARB and low doe HCTZ - would continue.   3. HLD - on Pravachol - would continue - lab today.   4. Palpitations - very well controlled on low dose beta blocker. Will continue.   5. Known RBBB/LAFB - on very low dose beta blocker - no syncope noted - would monitor - needs EKG on return.   6. Very strong FH+ for CAD  Current medicines are reviewed with the patient today.  The patient does not have concerns regarding medicines other than what has been noted above.  The following changes have been made:  See above.  Labs/ tests ordered today include:    Orders Placed This Encounter  Procedures  . Basic metabolic panel  . CBC  . Hepatic function panel  . Lipid panel     Disposition:   FU with Dr. Irish Lack in one year.    Patient is agreeable to this plan and will call if any problems develop in the interim.   SignedTruitt Merle, NP  10/12/2019 8:44 AM  [facility] [address] [place], [place]  [address] Phone: [phone] Fax: [phone]

## 2019-10-12 ENCOUNTER — Encounter: Payer: Self-pay | Admitting: Nurse Practitioner

## 2019-10-12 ENCOUNTER — Ambulatory Visit: Payer: 59 | Admitting: Nurse Practitioner

## 2019-10-12 ENCOUNTER — Other Ambulatory Visit: Payer: Self-pay

## 2019-10-12 VITALS — BP 124/78 | HR 70 | Ht 63.0 in | Wt 130.0 lb

## 2019-10-12 DIAGNOSIS — E785 Hyperlipidemia, unspecified: Secondary | ICD-10-CM

## 2019-10-12 DIAGNOSIS — I10 Essential (primary) hypertension: Secondary | ICD-10-CM | POA: Diagnosis not present

## 2019-10-12 DIAGNOSIS — R002 Palpitations: Secondary | ICD-10-CM | POA: Diagnosis not present

## 2019-10-12 DIAGNOSIS — I259 Chronic ischemic heart disease, unspecified: Secondary | ICD-10-CM

## 2019-10-12 LAB — HEPATIC FUNCTION PANEL
ALT: 21 IU/L (ref 0–32)
AST: 21 IU/L (ref 0–40)
Albumin: 4.5 g/dL (ref 3.8–4.8)
Alkaline Phosphatase: 107 IU/L (ref 48–121)
Bilirubin Total: 0.3 mg/dL (ref 0.0–1.2)
Bilirubin, Direct: 0.09 mg/dL (ref 0.00–0.40)
Total Protein: 6.8 g/dL (ref 6.0–8.5)

## 2019-10-12 LAB — LIPID PANEL
Chol/HDL Ratio: 3 ratio (ref 0.0–4.4)
Cholesterol, Total: 169 mg/dL (ref 100–199)
HDL: 56 mg/dL (ref >39)
LDL Chol Calc (NIH): 93 mg/dL (ref 0–99)
Triglycerides: 111 mg/dL (ref 0–149)
VLDL Cholesterol Cal: 20 mg/dL (ref 5–40)

## 2019-10-12 LAB — CBC
Hematocrit: 38.3 % (ref 34.0–46.6)
Hemoglobin: 13.5 g/dL (ref 11.1–15.9)
MCH: 28.7 pg (ref 26.6–33.0)
MCHC: 35.2 g/dL (ref 31.5–35.7)
MCV: 81 fL (ref 79–97)
Platelets: 226 10*3/uL (ref 150–450)
RBC: 4.71 x10E6/uL (ref 3.77–5.28)
RDW: 11.9 % (ref 11.7–15.4)
WBC: 4.3 10*3/uL (ref 3.4–10.8)

## 2019-10-12 LAB — BASIC METABOLIC PANEL
BUN/Creatinine Ratio: 18 (ref 12–28)
BUN: 14 mg/dL (ref 8–27)
CO2: 26 mmol/L (ref 20–29)
Calcium: 9.7 mg/dL (ref 8.7–10.3)
Chloride: 101 mmol/L (ref 96–106)
Creatinine, Ser: 0.79 mg/dL (ref 0.57–1.00)
GFR calc Af Amer: 92 mL/min/{1.73_m2} (ref >59)
GFR calc non Af Amer: 80 mL/min/{1.73_m2} (ref >59)
Glucose: 93 mg/dL (ref 65–99)
Potassium: 3.8 mmol/L (ref 3.5–5.2)
Sodium: 141 mmol/L (ref 134–144)

## 2019-10-12 NOTE — Patient Instructions (Addendum)
After Visit Summary:  We will be checking the following labs today - BMET, CBC, HPF and Lipids   Medication Instructions:    Continue with your current medicines.   Restart baby aspirin please.   Ok to stay on the 1/2 dose Metoprolol in the evening.    If you need a refill on your cardiac medications before your next appointment, please call your pharmacy.     Testing/Procedures To Be Arranged:  N/A  Follow-Up:   See Dr. Irish Lack in one year.You will receive a reminder letter in the mail two months in advance. If you don't receive a letter, please call our office to schedule the follow-up appointment..    At [facility], you and your health needs are our priority.  As part of our continuing mission to provide you with exceptional heart care, we have created designated Provider Care Teams.  These Care Teams include your primary Cardiologist (physician) and Advanced Practice Providers (APPs -  Physician Assistants and Nurse Practitioners) who all work together to provide you with the care you need, when you need it.  Special Instructions:  . Stay safe, wash your hands for at least 20 seconds and wear a mask when needed.  . It was good to talk with you today.    Call the [place] office at [phone] if you have any questions, problems or concerns.

## 2019-10-13 ENCOUNTER — Other Ambulatory Visit: Payer: Self-pay | Admitting: *Deleted

## 2019-10-13 DIAGNOSIS — E785 Hyperlipidemia, unspecified: Secondary | ICD-10-CM

## 2019-10-13 MED ORDER — ROSUVASTATIN CALCIUM 10 MG PO TABS: 10.0000 mg | ORAL_TABLET | Freq: Every day | ORAL | 11 refills | Status: DC

## 2019-11-02 ENCOUNTER — Other Ambulatory Visit (HOSPITAL_COMMUNITY): Payer: Self-pay | Admitting: Internal Medicine

## 2019-11-11 DIAGNOSIS — H26491 Other secondary cataract, right eye: Secondary | ICD-10-CM | POA: Diagnosis not present

## 2019-11-11 DIAGNOSIS — H35371 Puckering of macula, right eye: Secondary | ICD-10-CM | POA: Diagnosis not present

## 2019-12-01 ENCOUNTER — Other Ambulatory Visit: Payer: Self-pay

## 2019-12-01 ENCOUNTER — Other Ambulatory Visit: Payer: 59 | Admitting: *Deleted

## 2019-12-01 DIAGNOSIS — E785 Hyperlipidemia, unspecified: Secondary | ICD-10-CM

## 2019-12-01 DIAGNOSIS — Z20828 Contact with and (suspected) exposure to other viral communicable diseases: Secondary | ICD-10-CM | POA: Diagnosis not present

## 2019-12-01 LAB — HEPATIC FUNCTION PANEL
ALT: 24 IU/L (ref 0–32)
AST: 21 IU/L (ref 0–40)
Albumin: 4.4 g/dL (ref 3.8–4.8)
Alkaline Phosphatase: 105 IU/L (ref 48–121)
Bilirubin Total: 0.2 mg/dL (ref 0.0–1.2)
Bilirubin, Direct: 0.1 mg/dL (ref 0.00–0.40)
Total Protein: 6.6 g/dL (ref 6.0–8.5)

## 2019-12-01 LAB — LIPID PANEL
Chol/HDL Ratio: 2.5 ratio (ref 0.0–4.4)
Cholesterol, Total: 132 mg/dL (ref 100–199)
HDL: 52 mg/dL (ref >39)
LDL Chol Calc (NIH): 60 mg/dL (ref 0–99)
Triglycerides: 113 mg/dL (ref 0–149)
VLDL Cholesterol Cal: 20 mg/dL (ref 5–40)

## 2019-12-03 ENCOUNTER — Other Ambulatory Visit (HOSPITAL_COMMUNITY): Payer: Self-pay | Admitting: Internal Medicine

## 2019-12-23 DIAGNOSIS — R05 Cough: Secondary | ICD-10-CM | POA: Diagnosis not present

## 2019-12-23 DIAGNOSIS — J301 Allergic rhinitis due to pollen: Secondary | ICD-10-CM | POA: Diagnosis not present

## 2019-12-25 ENCOUNTER — Other Ambulatory Visit: Payer: Self-pay | Admitting: Physician Assistant

## 2019-12-25 ENCOUNTER — Ambulatory Visit (HOSPITAL_COMMUNITY)
Admission: RE | Admit: 2019-12-25 | Discharge: 2019-12-25 | Disposition: A | Discharge status: Home / Self Care | Payer: 59 | Source: Ambulatory Visit | Attending: Pulmonary Disease | Admitting: Pulmonary Disease

## 2019-12-25 DIAGNOSIS — I10 Essential (primary) hypertension: Secondary | ICD-10-CM | POA: Insufficient documentation

## 2019-12-25 DIAGNOSIS — I259 Chronic ischemic heart disease, unspecified: Secondary | ICD-10-CM

## 2019-12-25 DIAGNOSIS — U071 COVID-19: Secondary | ICD-10-CM

## 2019-12-25 MED ORDER — SODIUM CHLORIDE 0.9 % IV SOLN
1200.0000 mg | Freq: Once | INTRAVENOUS | Status: AC
  Administered 2019-12-25: 1200 mg via INTRAVENOUS

## 2019-12-25 MED ORDER — ALBUTEROL SULFATE HFA 108 (90 BASE) MCG/ACT IN AERS: 2.0000 | INHALATION_SPRAY | Freq: Once | RESPIRATORY_TRACT | Status: DC | PRN

## 2019-12-25 MED ORDER — DIPHENHYDRAMINE HCL 50 MG/ML IJ SOLN: 50.0000 mg | Freq: Once | INTRAMUSCULAR | Status: DC | PRN

## 2019-12-25 MED ORDER — METHYLPREDNISOLONE SODIUM SUCC 125 MG IJ SOLR: 125.0000 mg | Freq: Once | INTRAMUSCULAR | Status: DC | PRN

## 2019-12-25 MED ORDER — SODIUM CHLORIDE 0.9 % IV SOLN: INTRAVENOUS | Status: DC | PRN

## 2019-12-25 MED ORDER — EPINEPHRINE 0.3 MG/0.3ML IJ SOAJ: 0.3000 mg | Freq: Once | INTRAMUSCULAR | Status: DC | PRN

## 2019-12-25 MED ORDER — FAMOTIDINE IN NACL 20-0.9 MG/50ML-% IV SOLN: 20.0000 mg | Freq: Once | INTRAVENOUS | Status: DC | PRN

## 2019-12-25 NOTE — Progress Notes (Signed)
  Diagnosis: COVID-19  Physician: Dr, Joya Gaskins  Procedure: [facility] Infusion [facility] Med: casirivimab\imdevimab infusion - Provided patient with casirivimab\imdevimab fact sheet for patients, parents and caregivers prior to infusion.  Complications: No immediate complications noted.  Discharge: Discharged home   Ludwig Lean 12/25/2019

## 2019-12-25 NOTE — Discharge Instructions (Signed)

## 2019-12-25 NOTE — Progress Notes (Signed)
I connected by phone with [name] on 12/25/2019 at 11:10 AM to discuss the potential use of a new treatment for mild to moderate COVID-19 viral infection in non-hospitalized patients.  This patient is a 64 y.o. female that meets the FDA criteria for Emergency Use Authorization of COVID monoclonal antibody casirivimab/imdevimab.  Has a (+) direct SARS-CoV-2 viral test result  Has mild or moderate COVID-19   Is NOT hospitalized due to COVID-19  Is within 10 days of symptom onset  Has at least one of the high risk factor(s) for progression to severe COVID-19 and/or hospitalization as defined in EUA.  Specific high risk criteria : Cardiovascular disease or hypertension   I have spoken and communicated the following to the patient or parent/caregiver regarding COVID monoclonal antibody treatment:  1. FDA has authorized the emergency use for the treatment of mild to moderate COVID-19 in adults and pediatric patients with positive results of direct SARS-CoV-2 viral testing who are 44 years of age and older weighing at least 40 kg, and who are at high risk for progressing to severe COVID-19 and/or hospitalization.  2. The significant known and potential risks and benefits of COVID monoclonal antibody, and the extent to which such potential risks and benefits are unknown.  3. Information on available alternative treatments and the risks and benefits of those alternatives, including clinical trials.  4. Patients treated with COVID monoclonal antibody should continue to self-isolate and use infection control measures (e.g., wear mask, isolate, social distance, avoid sharing personal items, clean and disinfect "high touch" surfaces, and frequent handwashing) according to CDC guidelines.   5. The patient or parent/caregiver has the option to accept or refuse COVID monoclonal antibody treatment.  After reviewing this information with the patient, the patient has agreed to receive one of the  available covid 19 monoclonal antibodies and will be provided an appropriate fact sheet prior to infusion.  Sx onset 9/20. Set up for infusion on 9/24 @ 2:30pm. Directions given to [facility]. Pt is aware that insurance will be charged an infusion fee. Pt is fully vaccinated.   Angelena Form 12/25/2019 11:10 AM

## 2020-01-14 ENCOUNTER — Other Ambulatory Visit (HOSPITAL_COMMUNITY): Payer: Self-pay | Admitting: Dermatology

## 2020-01-14 DIAGNOSIS — L821 Other seborrheic keratosis: Secondary | ICD-10-CM | POA: Diagnosis not present

## 2020-01-14 DIAGNOSIS — D2261 Melanocytic nevi of right upper limb, including shoulder: Secondary | ICD-10-CM | POA: Diagnosis not present

## 2020-01-14 DIAGNOSIS — L812 Freckles: Secondary | ICD-10-CM | POA: Diagnosis not present

## 2020-01-14 DIAGNOSIS — D1801 Hemangioma of skin and subcutaneous tissue: Secondary | ICD-10-CM | POA: Diagnosis not present

## 2020-01-14 DIAGNOSIS — D225 Melanocytic nevi of trunk: Secondary | ICD-10-CM | POA: Diagnosis not present

## 2020-01-14 DIAGNOSIS — B372 Candidiasis of skin and nail: Secondary | ICD-10-CM | POA: Diagnosis not present

## 2020-01-27 ENCOUNTER — Other Ambulatory Visit (HOSPITAL_COMMUNITY): Payer: Self-pay | Admitting: General Surgery

## 2020-01-27 DIAGNOSIS — D013 Carcinoma in situ of anus and anal canal: Secondary | ICD-10-CM | POA: Diagnosis not present

## 2020-01-27 MED FILL — PROCTOZONE-HC 2.5 % CREA: 2.5 | 7 days supply | Qty: 30 | Fill #0

## 2020-02-05 MED FILL — PROCTOZONE-HC 2.5 % CREA: 2.5 | 7 days supply | Qty: 30 | Fill #0

## 2020-02-22 DIAGNOSIS — Z0000 Encounter for general adult medical examination without abnormal findings: Secondary | ICD-10-CM | POA: Diagnosis not present

## 2020-02-22 DIAGNOSIS — F419 Anxiety disorder, unspecified: Secondary | ICD-10-CM | POA: Diagnosis not present

## 2020-02-22 DIAGNOSIS — F3341 Major depressive disorder, recurrent, in partial remission: Secondary | ICD-10-CM | POA: Diagnosis not present

## 2020-02-22 DIAGNOSIS — M85859 Other specified disorders of bone density and structure, unspecified thigh: Secondary | ICD-10-CM | POA: Diagnosis not present

## 2020-02-22 DIAGNOSIS — I10 Essential (primary) hypertension: Secondary | ICD-10-CM | POA: Diagnosis not present

## 2020-02-22 DIAGNOSIS — G43909 Migraine, unspecified, not intractable, without status migrainosus: Secondary | ICD-10-CM | POA: Diagnosis not present

## 2020-02-22 DIAGNOSIS — M778 Other enthesopathies, not elsewhere classified: Secondary | ICD-10-CM | POA: Diagnosis not present

## 2020-02-24 ENCOUNTER — Other Ambulatory Visit (HOSPITAL_COMMUNITY): Payer: Self-pay | Admitting: Urology

## 2020-02-29 ENCOUNTER — Other Ambulatory Visit (HOSPITAL_COMMUNITY): Payer: Self-pay | Admitting: Internal Medicine

## 2020-03-01 DIAGNOSIS — N3010 Interstitial cystitis (chronic) without hematuria: Secondary | ICD-10-CM | POA: Diagnosis not present

## 2020-03-07 DIAGNOSIS — B952 Enterococcus as the cause of diseases classified elsewhere: Secondary | ICD-10-CM | POA: Diagnosis not present

## 2020-03-07 DIAGNOSIS — N390 Urinary tract infection, site not specified: Secondary | ICD-10-CM | POA: Diagnosis not present

## 2020-03-10 ENCOUNTER — Other Ambulatory Visit (HOSPITAL_COMMUNITY): Payer: Self-pay | Admitting: Urology

## 2020-03-10 MED FILL — NITROFURANTOIN MCR 100 MG C: 100 | 7 days supply | Qty: 14 | Fill #0

## 2020-03-29 ENCOUNTER — Other Ambulatory Visit (HOSPITAL_COMMUNITY): Payer: Self-pay | Admitting: Obstetrics and Gynecology

## 2020-04-12 ENCOUNTER — Other Ambulatory Visit (HOSPITAL_COMMUNITY): Payer: Self-pay | Admitting: Adult Health

## 2020-04-12 DIAGNOSIS — R8271 Bacteriuria: Secondary | ICD-10-CM | POA: Diagnosis not present

## 2020-04-12 DIAGNOSIS — N3010 Interstitial cystitis (chronic) without hematuria: Secondary | ICD-10-CM | POA: Diagnosis not present

## 2020-04-14 ENCOUNTER — Other Ambulatory Visit (HOSPITAL_COMMUNITY): Payer: Self-pay | Admitting: Physician Assistant

## 2020-06-13 DIAGNOSIS — H524 Presbyopia: Secondary | ICD-10-CM | POA: Diagnosis not present

## 2020-06-13 DIAGNOSIS — H52203 Unspecified astigmatism, bilateral: Secondary | ICD-10-CM | POA: Diagnosis not present

## 2020-06-17 ENCOUNTER — Other Ambulatory Visit (HOSPITAL_COMMUNITY): Payer: Self-pay | Admitting: Internal Medicine

## 2020-07-01 ENCOUNTER — Other Ambulatory Visit (HOSPITAL_COMMUNITY): Payer: Self-pay | Admitting: Obstetrics and Gynecology

## 2020-07-03 ENCOUNTER — Other Ambulatory Visit (HOSPITAL_COMMUNITY): Payer: Self-pay

## 2020-07-03 MED ORDER — ESTRADIOL 10 MCG VA TABS
10.0000 ug | ORAL_TABLET | VAGINAL | 0 refills | Status: DC
  Filled 2020-07-03: qty 8, 28d supply, fill #0

## 2020-07-03 MED FILL — Rosuvastatin Calcium Tab 10 MG: ORAL | 30 days supply | Qty: 30 | Fill #0 | Status: AC

## 2020-07-04 ENCOUNTER — Other Ambulatory Visit (HOSPITAL_COMMUNITY): Payer: Self-pay

## 2020-07-14 ENCOUNTER — Other Ambulatory Visit (HOSPITAL_COMMUNITY): Payer: Self-pay

## 2020-07-14 DIAGNOSIS — Z113 Encounter for screening for infections with a predominantly sexual mode of transmission: Secondary | ICD-10-CM | POA: Diagnosis not present

## 2020-07-14 DIAGNOSIS — Z124 Encounter for screening for malignant neoplasm of cervix: Secondary | ICD-10-CM | POA: Diagnosis not present

## 2020-07-14 DIAGNOSIS — Z6822 Body mass index (BMI) 22.0-22.9, adult: Secondary | ICD-10-CM | POA: Diagnosis not present

## 2020-07-14 DIAGNOSIS — N952 Postmenopausal atrophic vaginitis: Secondary | ICD-10-CM | POA: Diagnosis not present

## 2020-07-14 DIAGNOSIS — Z01411 Encounter for gynecological examination (general) (routine) with abnormal findings: Secondary | ICD-10-CM | POA: Diagnosis not present

## 2020-07-14 DIAGNOSIS — M8580 Other specified disorders of bone density and structure, unspecified site: Secondary | ICD-10-CM | POA: Diagnosis not present

## 2020-07-14 DIAGNOSIS — Z01419 Encounter for gynecological examination (general) (routine) without abnormal findings: Secondary | ICD-10-CM | POA: Diagnosis not present

## 2020-07-14 MED ORDER — ESTRADIOL 10 MCG VA TABS
ORAL_TABLET | VAGINAL | 12 refills | Status: DC
  Filled 2020-08-02: qty 8, 28d supply, fill #0
  Filled 2020-09-01: qty 8, 28d supply, fill #1
  Filled 2020-10-04: qty 16, 56d supply, fill #2
  Filled 2020-11-30: qty 16, 56d supply, fill #0
  Filled 2021-02-09: qty 16, 56d supply, fill #1
  Filled 2021-04-02: qty 16, 56d supply, fill #2
  Filled 2021-05-29: qty 16, 56d supply, fill #3

## 2020-07-19 ENCOUNTER — Other Ambulatory Visit (HOSPITAL_COMMUNITY): Payer: Self-pay

## 2020-07-19 MED ORDER — LEVOCETIRIZINE DIHYDROCHLORIDE 5 MG PO TABS
5.0000 mg | ORAL_TABLET | Freq: Every day | ORAL | 1 refills | Status: DC
  Filled 2020-07-19: qty 90, 90d supply, fill #0

## 2020-07-31 MED FILL — Rosuvastatin Calcium Tab 10 MG: ORAL | 30 days supply | Qty: 30 | Fill #1 | Status: AC

## 2020-08-01 ENCOUNTER — Other Ambulatory Visit (HOSPITAL_COMMUNITY): Payer: Self-pay | Admitting: Obstetrics and Gynecology

## 2020-08-01 ENCOUNTER — Other Ambulatory Visit (HOSPITAL_COMMUNITY): Payer: Self-pay

## 2020-08-02 ENCOUNTER — Other Ambulatory Visit (HOSPITAL_COMMUNITY): Payer: Self-pay

## 2020-08-19 ENCOUNTER — Other Ambulatory Visit (HOSPITAL_COMMUNITY): Payer: Self-pay

## 2020-08-22 ENCOUNTER — Other Ambulatory Visit (HOSPITAL_COMMUNITY): Payer: Self-pay

## 2020-08-22 DIAGNOSIS — F3341 Major depressive disorder, recurrent, in partial remission: Secondary | ICD-10-CM | POA: Diagnosis not present

## 2020-08-22 DIAGNOSIS — I10 Essential (primary) hypertension: Secondary | ICD-10-CM | POA: Diagnosis not present

## 2020-08-22 DIAGNOSIS — E7800 Pure hypercholesterolemia, unspecified: Secondary | ICD-10-CM | POA: Diagnosis not present

## 2020-08-22 DIAGNOSIS — G43909 Migraine, unspecified, not intractable, without status migrainosus: Secondary | ICD-10-CM | POA: Diagnosis not present

## 2020-08-22 DIAGNOSIS — F419 Anxiety disorder, unspecified: Secondary | ICD-10-CM | POA: Diagnosis not present

## 2020-08-22 MED ORDER — TOPIRAMATE 25 MG PO TABS
ORAL_TABLET | ORAL | 1 refills | Status: DC
  Filled 2020-08-22 – 2020-09-27 (×2): qty 90, 90d supply, fill #0

## 2020-08-22 MED ORDER — VENLAFAXINE HCL 100 MG PO TABS
ORAL_TABLET | ORAL | 1 refills | Status: DC
  Filled 2020-08-22: qty 90, 90d supply, fill #0

## 2020-08-22 MED ORDER — BUSPIRONE HCL 5 MG PO TABS
ORAL_TABLET | ORAL | 5 refills | Status: DC
  Filled 2020-08-22: qty 60, 30d supply, fill #0

## 2020-08-23 ENCOUNTER — Other Ambulatory Visit (HOSPITAL_COMMUNITY): Payer: Self-pay

## 2020-08-24 ENCOUNTER — Other Ambulatory Visit (HOSPITAL_COMMUNITY): Payer: Self-pay

## 2020-08-24 MED ORDER — METOPROLOL SUCCINATE ER 25 MG PO TB24
ORAL_TABLET | ORAL | 3 refills | Status: DC
  Filled 2020-08-24: qty 45, 90d supply, fill #0

## 2020-08-30 ENCOUNTER — Other Ambulatory Visit (HOSPITAL_COMMUNITY): Payer: Self-pay

## 2020-08-30 MED FILL — Rosuvastatin Calcium Tab 10 MG: ORAL | 60 days supply | Qty: 60 | Fill #2 | Status: AC

## 2020-08-31 ENCOUNTER — Other Ambulatory Visit (HOSPITAL_COMMUNITY): Payer: Self-pay

## 2020-08-31 MED ORDER — HYDROCHLOROTHIAZIDE 12.5 MG PO CAPS
ORAL_CAPSULE | ORAL | 1 refills | Status: DC
  Filled 2020-08-31: qty 90, 90d supply, fill #0

## 2020-09-01 ENCOUNTER — Other Ambulatory Visit: Payer: Self-pay | Admitting: Obstetrics and Gynecology

## 2020-09-01 ENCOUNTER — Other Ambulatory Visit (HOSPITAL_COMMUNITY): Payer: Self-pay

## 2020-09-01 DIAGNOSIS — Z1231 Encounter for screening mammogram for malignant neoplasm of breast: Secondary | ICD-10-CM

## 2020-09-13 ENCOUNTER — Other Ambulatory Visit (HOSPITAL_COMMUNITY): Payer: Self-pay

## 2020-09-13 DIAGNOSIS — R300 Dysuria: Secondary | ICD-10-CM | POA: Diagnosis not present

## 2020-09-13 DIAGNOSIS — R110 Nausea: Secondary | ICD-10-CM | POA: Diagnosis not present

## 2020-09-13 DIAGNOSIS — R1012 Left upper quadrant pain: Secondary | ICD-10-CM | POA: Diagnosis not present

## 2020-09-13 DIAGNOSIS — R634 Abnormal weight loss: Secondary | ICD-10-CM | POA: Diagnosis not present

## 2020-09-13 MED ORDER — ONDANSETRON HCL 4 MG PO TABS
ORAL_TABLET | ORAL | 0 refills | Status: AC
  Filled 2020-09-13: qty 15, 7d supply, fill #0

## 2020-09-13 MED ORDER — SACCHAROMYCES BOULARDII 250 MG PO CAPS: ORAL_CAPSULE | ORAL | 1 refills | Status: AC

## 2020-09-19 ENCOUNTER — Other Ambulatory Visit (HOSPITAL_COMMUNITY): Payer: Self-pay

## 2020-09-19 MED ORDER — POTASSIUM CHLORIDE CRYS ER 20 MEQ PO TBCR
EXTENDED_RELEASE_TABLET | ORAL | 0 refills | Status: DC
  Filled 2020-09-19: qty 10, 3d supply, fill #0

## 2020-09-20 ENCOUNTER — Other Ambulatory Visit (HOSPITAL_COMMUNITY): Payer: Self-pay

## 2020-09-21 ENCOUNTER — Other Ambulatory Visit (HOSPITAL_COMMUNITY): Payer: Self-pay

## 2020-09-22 ENCOUNTER — Other Ambulatory Visit (HOSPITAL_COMMUNITY): Payer: Self-pay

## 2020-09-22 MED ORDER — LOSARTAN POTASSIUM 50 MG PO TABS
ORAL_TABLET | ORAL | 3 refills | Status: DC
  Filled 2020-09-22: qty 90, 90d supply, fill #0

## 2020-09-23 IMAGING — MG DIGITAL SCREENING BILATERAL MAMMOGRAM WITH TOMO AND CAD
8 series · 8 of 24 positions shown · non-contrast
Comparison: Previous exam(s).

CLINICAL DATA: Screening.

EXAM:
DIGITAL SCREENING BILATERAL MAMMOGRAM WITH TOMO AND CAD

[R CC synth-2D]
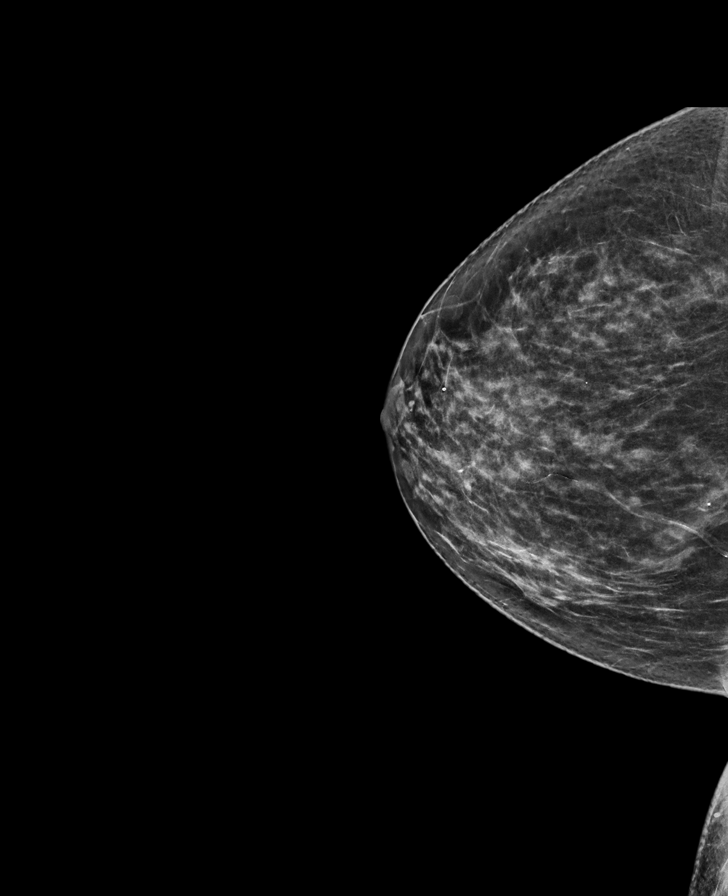

[L MLO synth-2D]
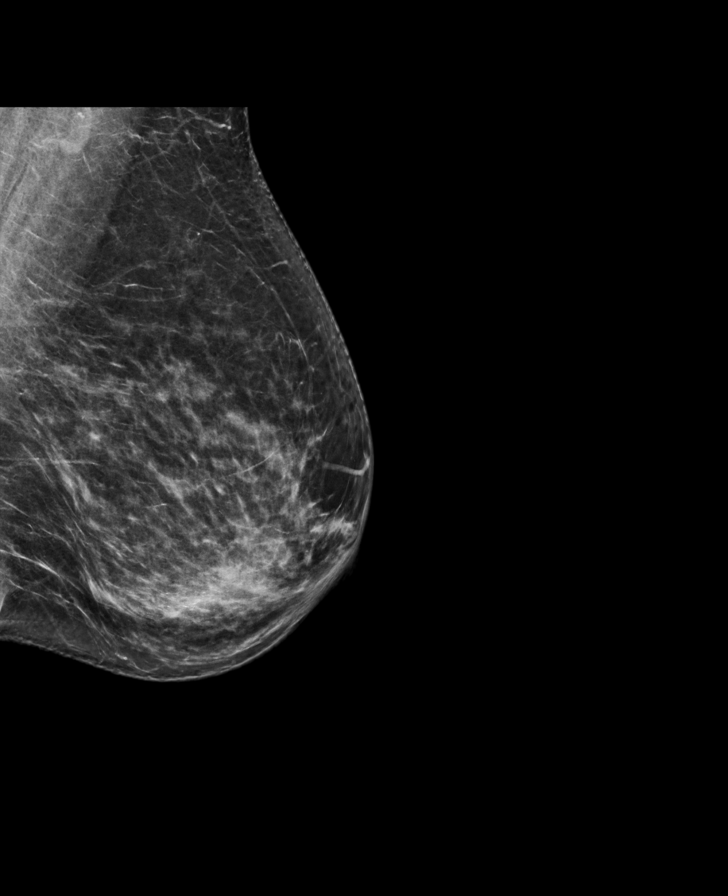

[L CC synth-2D]
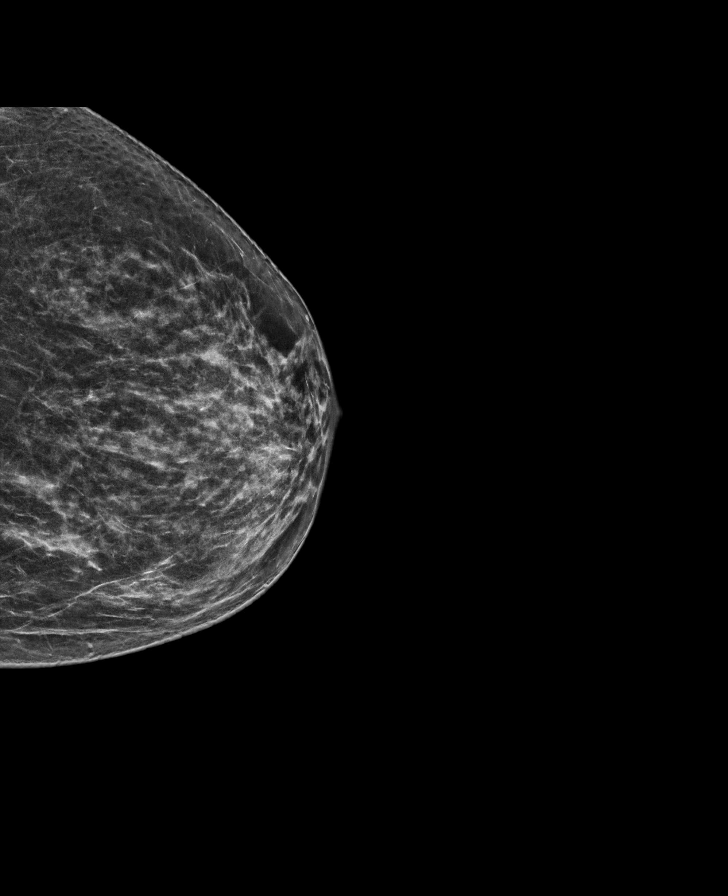

[R MLO synth-2D]
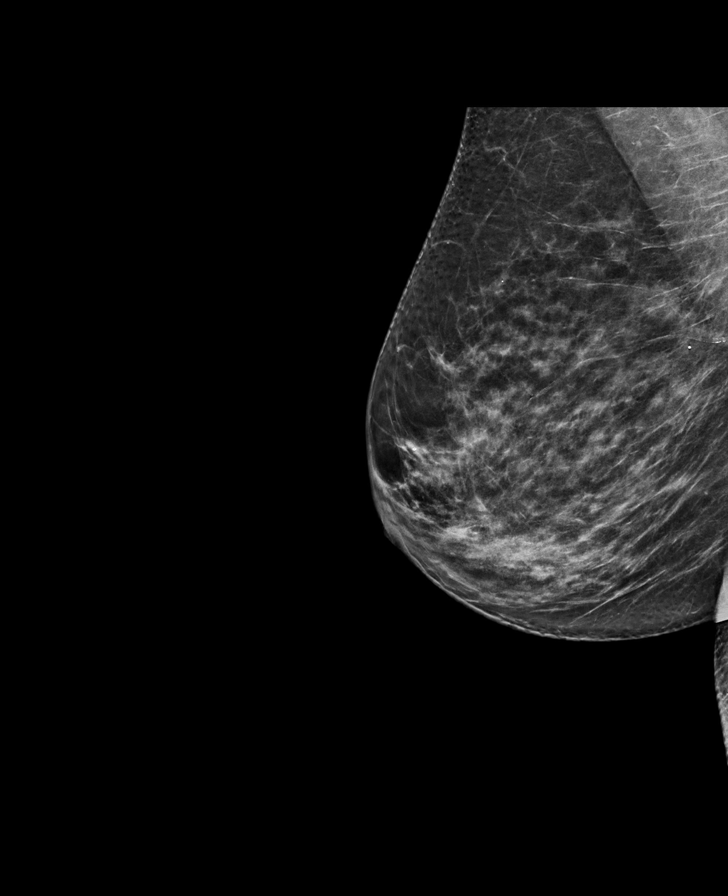

[L MLO tomo · tomo slice 39/76.0]
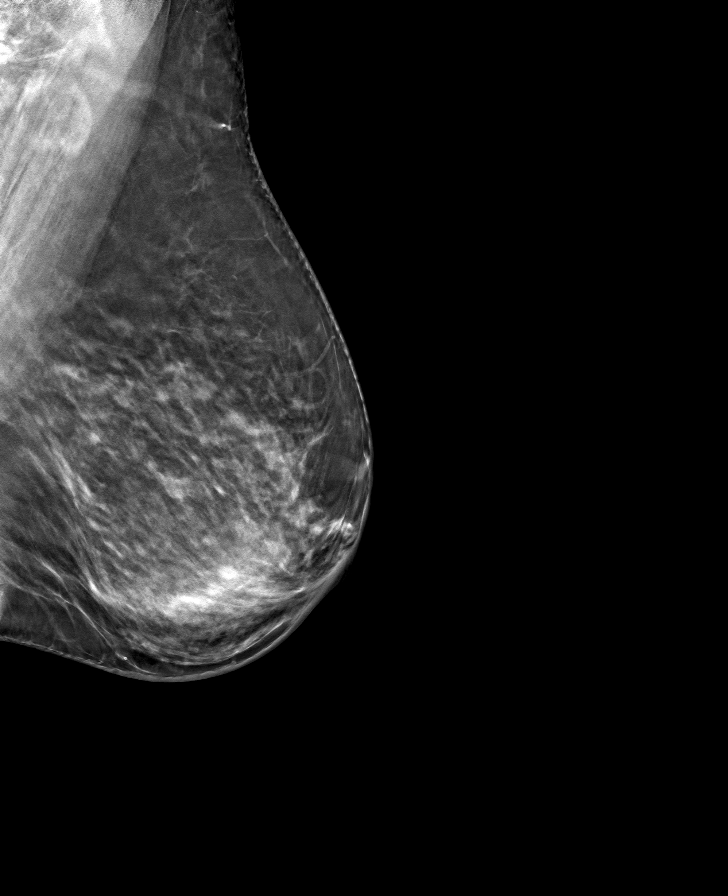

[R CC tomo · tomo slice 33/66.0]
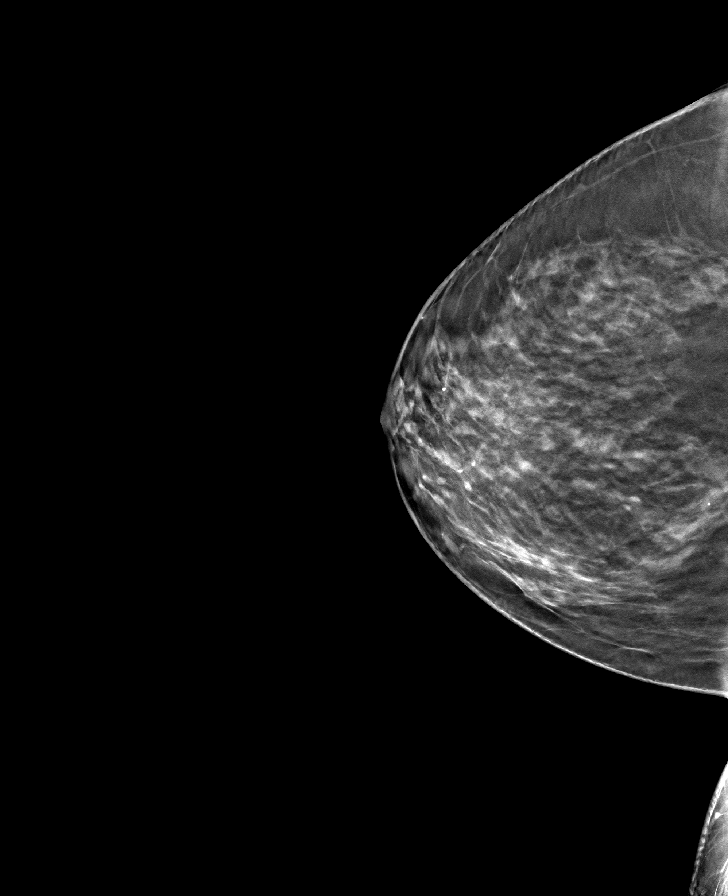

[R MLO tomo · tomo slice 35/70.0]
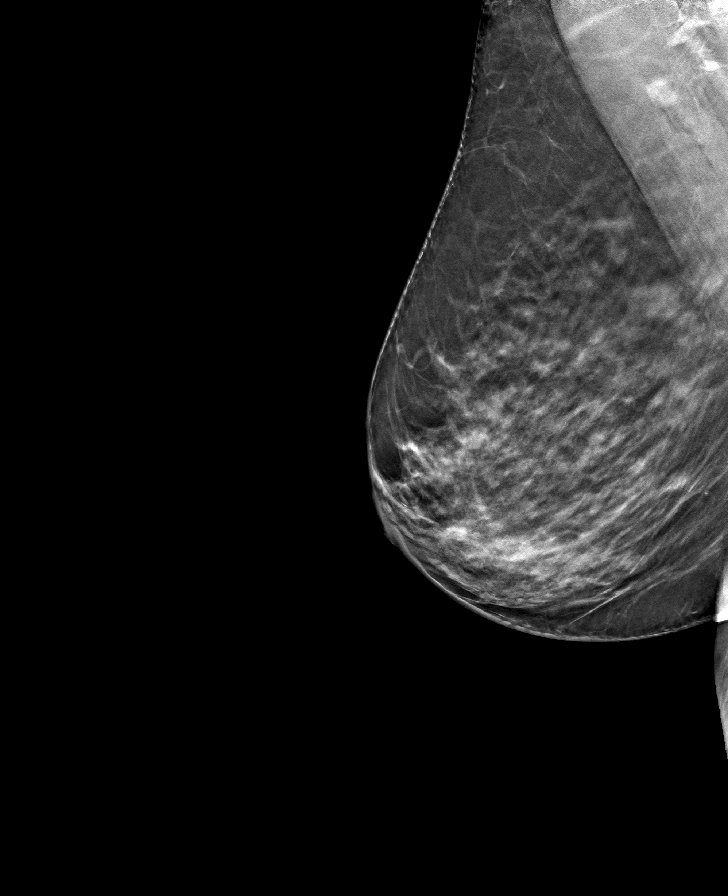

[L CC tomo · tomo slice 33/64.0]
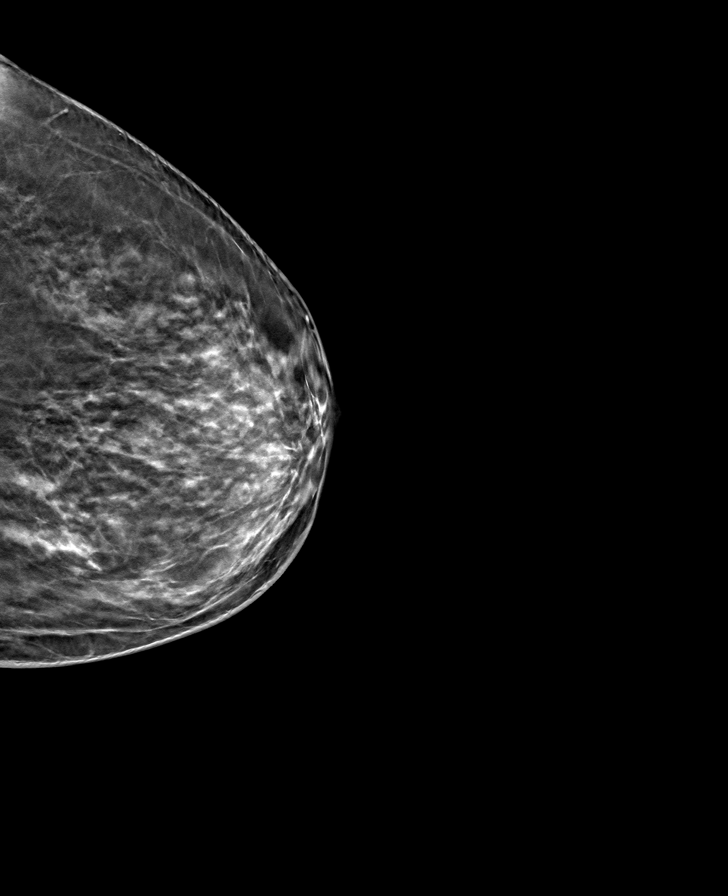

[8 of 24 positions shown; findings below may reference images not displayed]

ACR Breast Density Category c: The breast tissue is heterogeneously
dense, which may obscure small masses.
FINDINGS: There are no findings suspicious for malignancy. Images were
processed with CAD.
IMPRESSION: No mammographic evidence of malignancy. A result letter of this
screening mammogram will be mailed directly to the patient.

RECOMMENDATION:
Screening mammogram in one year. (Code:[id number]-U-LHB)

BI-RADS CATEGORY  1: Negative.

## 2020-09-25 ENCOUNTER — Other Ambulatory Visit (HOSPITAL_COMMUNITY): Payer: Self-pay

## 2020-09-27 ENCOUNTER — Other Ambulatory Visit (HOSPITAL_COMMUNITY): Payer: Self-pay

## 2020-09-27 MED FILL — Methenamine-Hyosc-Meth Blue-Sod Phos-Phen Sal Cap 118 MG: ORAL | 30 days supply | Qty: 60 | Fill #0 | Status: CN

## 2020-09-27 MED FILL — Methenamine-Hyosc-Meth Blue-Sod Phos-Phen Sal Cap 118 MG: ORAL | 30 days supply | Qty: 60 | Fill #0 | Status: AC

## 2020-09-28 ENCOUNTER — Other Ambulatory Visit (HOSPITAL_COMMUNITY): Payer: Self-pay

## 2020-09-29 ENCOUNTER — Other Ambulatory Visit (HOSPITAL_COMMUNITY): Payer: Self-pay

## 2020-09-29 MED ORDER — HYOSCYAMINE SULFATE ER 0.375 MG PO TB12
0.3750 mg | ORAL_TABLET | Freq: Every day | ORAL | 5 refills | Status: DC
  Filled 2020-09-29: qty 30, 30d supply, fill #0

## 2020-09-30 ENCOUNTER — Other Ambulatory Visit (HOSPITAL_COMMUNITY): Payer: Self-pay

## 2020-10-04 ENCOUNTER — Other Ambulatory Visit (HOSPITAL_COMMUNITY): Payer: Self-pay

## 2020-10-07 ENCOUNTER — Other Ambulatory Visit (HOSPITAL_COMMUNITY): Payer: Self-pay

## 2020-10-11 ENCOUNTER — Other Ambulatory Visit (HOSPITAL_COMMUNITY): Payer: Self-pay

## 2020-10-11 MED ORDER — TOPIRAMATE 25 MG PO TABS
25.0000 mg | ORAL_TABLET | Freq: Every day | ORAL | 1 refills | Status: DC
  Filled 2020-10-11: qty 90, 90d supply, fill #0

## 2020-10-12 DIAGNOSIS — R8271 Bacteriuria: Secondary | ICD-10-CM | POA: Diagnosis not present

## 2020-10-12 DIAGNOSIS — N200 Calculus of kidney: Secondary | ICD-10-CM | POA: Diagnosis not present

## 2020-10-12 DIAGNOSIS — N3010 Interstitial cystitis (chronic) without hematuria: Secondary | ICD-10-CM | POA: Diagnosis not present

## 2020-10-14 ENCOUNTER — Ambulatory Visit: Payer: 59

## 2020-10-15 ENCOUNTER — Other Ambulatory Visit: Payer: Self-pay

## 2020-10-15 ENCOUNTER — Ambulatory Visit
Admission: RE | Admit: 2020-10-15 | Discharge: 2020-10-15 | Disposition: A | Discharge status: Home / Self Care | Payer: 59 | Source: Ambulatory Visit | Attending: Obstetrics and Gynecology | Admitting: Obstetrics and Gynecology

## 2020-10-15 DIAGNOSIS — Z1231 Encounter for screening mammogram for malignant neoplasm of breast: Secondary | ICD-10-CM | POA: Diagnosis not present

## 2020-10-17 DIAGNOSIS — N200 Calculus of kidney: Secondary | ICD-10-CM | POA: Diagnosis not present

## 2020-10-19 NOTE — Progress Notes (Signed)
Cardiology Office Note   Date:  10/20/2020   ID:  April Rodriguez, DOB [birth year], MRN [id number]  PCP:  Leeroy Cha, MD    No chief complaint on file.  Family h/o CAD  Wt Readings from Last 3 Encounters:  10/20/20 124 lb (56.2 kg)  10/12/19 130 lb (59 kg)  08-Feb-2019 126 lb 1.9 oz (57.2 kg)       History of Present Illness: April Rodriguez is a 65 y.o. female  had prior cardiac cath back in 07/09/2015 for chest pain - this showed just very mild nonobstructive CAD. FH quite + for CAD - sister with prior stents Arlice Colt who is my patient also.) Other issues include HLD, HTN and GERD.    She has had some intermittent chest pain related to stress with dealing with elderly parents. Father had dementia and mother with prior stroke. They ended up being placed in long term care facility and both have subsequently passed away. Last seen in 02/08/23. BP ok. Lots of stress with COVID issues. Palpitations better with beta blocker therapy.   02/2019 Cardiac CT was done and was reassuring.:  "Calcium Score: 40 Agatston units.   Coronary Arteries: Right dominant with no anomalies   LM: No plaque or stenosis.   LAD system: Mixed plaque proximal LAD, mild (<50%) stenosis.   Circumflex system: No plaque or stenosis.   RCA system: No plaque or stenosis.   IMPRESSION: 1. Coronary artery calcium score 40 Agatston units. This places the patient in the 75th percentile for age and gender, suggesting intermediate risk for future cardiac events.   2.  Nonobstructive CAD."  She still works at [employer].  Her husband passed away in 07/08/20 from cancer.   Denies : Chest pain. Dizziness. Leg edema. Nitroglycerin use. Orthopnea. Palpitations. Paroxysmal nocturnal dyspnea. Shortness of breath. Syncope.    She walks regularly and does her yard work.  No cardiac sx with these activities.     Past Medical History:  Diagnosis Date   AIN grade III    Allergic rhinitis    Anxiety     Chronic constipation    Depression    Family history of adverse reaction to anesthesia    sister gets nauseated   Frequency of urination    GERD (gastroesophageal reflux disease)    Headache    migraines   History of hiatal hernia    Hypertension    IBS (irritable bowel syndrome)    Rash    UNDER BREAST    Past Surgical History:  Procedure Laterality Date   [place] BUNIONECTOMY RIGHT FOOT  03-07-2000   BREAST BIOPSY     CARDIAC CATHETERIZATION N/A 10/21/2015   Procedure: Left Heart Cath and Coronary Angiography;  Surgeon: Jettie Booze, MD;  Location: [facility];  Service: Cardiovascular;  Laterality: N/A;   CATARACT EXTRACTION W/ INTRAOCULAR LENS  IMPLANT, BILATERAL  07-08-2009   COLONOSCOPY  12-15-2009   COLONOSCOPY N/A 04/09/2017   Procedure: COLONOSCOPY;  Surgeon: Ronnette Juniper, MD;  Location: [facility];  Service: Gastroenterology;  Laterality: N/A;   COLONOSCOPY WITH PROPOFOL N/A 03/08/2016   Procedure: COLONOSCOPY WITH PROPOFOL;  Surgeon: Garlan Fair, MD;  Location: [facility];  Service: Endoscopy;  Laterality: N/A;   CYSTO/  BLADDER BX/  HYDRODISTENTION  01-30-2006   ESOPHAGOGASTRODUODENOSCOPY N/A 04/09/2017   Procedure: ESOPHAGOGASTRODUODENOSCOPY (EGD);  Surgeon: Ronnette Juniper, MD;  Location: [facility];  Service: Gastroenterology;  Laterality: N/A;   [place]  LEFT BREAST BX  1980's   BENIGN   RECTAL EXAM UNDER ANESTHESIA N/A 12/10/2014   Procedure: ANAL EXAM UNDER ANESTHESIA  EXCISIONAL BIOPSY OF PERIANAL NEOPLASM;  Surgeon: [name], MD;  Location: [place] [facility];  Service: General;  Laterality: N/A;   TUBAL LIGATION  1980's     Current Outpatient Medications  Medication Sig Dispense Refill   aspirin EC 81 MG tablet Take 1 tablet (81 mg total) by mouth daily. 90 tablet 3   aspirin-acetaminophen-caffeine (EXCEDRIN MIGRAINE) 250-250-65 MG tablet Take 1 tablet by mouth daily as needed for headache.     busPIRone (BUSPAR) 5 MG  tablet Take 2.5 mg by mouth daily.     clotrimazole-betamethasone (LOTRISONE) cream Apply 1 application topically as needed (rash).      docusate sodium (COLACE) 100 MG capsule Take 100 mg by mouth 2 (two) times daily as needed for mild constipation.     Estradiol 10 MCG TABS vaginal tablet INSERT 1 TABLET 2 TIMES A WEEK 18 tablet 0   Estradiol 10 MCG TABS vaginal tablet Place 1 tablet (10 mcg total) vaginally 2 (two) times a week. 8 tablet 0   Estradiol 10 MCG TABS vaginal tablet Insert 1 tablet twice a week by vaginal route as directed for 30 days. 8 tablet 12   FIBER PO Take 1 capsule by mouth daily as needed (constipation).     fluticasone (FLONASE) 50 MCG/ACT nasal spray Place 2 sprays daily into both nostrils. 16 g 6   hydrochlorothiazide (MICROZIDE) 12.5 MG capsule Take 12.5 mg by mouth daily.     hydrocortisone (ANUSOL-HC) 2.5 % rectal cream APPLY 1 APPLICATION DAILY AS NEEDED DO NOT USE FOR MORE THAN 7 DAYS IN A ROW 30 g 1   hydrOXYzine (ATARAX/VISTARIL) 25 MG tablet TAKE 1 TABLET BY MOUTH EVERY NIGHT AT BEDTIME AS NEEDED FOR BLADDER PAIN. 30 tablet 3   hyoscyamine (OSCIMIN SR) 0.375 MG 12 hr tablet Take 1 tablet (0.375 mg total) by mouth daily. 30 tablet 5   ibuprofen (ADVIL,MOTRIN) 200 MG tablet Take 200-400 mg by mouth daily as needed for headache or moderate pain.     ketoconazole (NIZORAL) 2 % cream APPLY ON THE SKIN TWICE A DAY; APPLY TO AFFECTED AREA TWICE A DAY FOR 2 WEEKS 60 g 1   levocetirizine (XYZAL) 5 MG tablet Take 5 mg by mouth daily as needed for allergies. If Claritin doesn't work     loratadine (CLARITIN) 10 MG tablet Take 10 mg by mouth daily as needed for allergies.     LORazepam (ATIVAN) 0.5 MG tablet TAKE 1 TABLET BY MOUTH EVERY NIGHT AT BEDTIME AS NEEDED 10 tablet 0   losartan (COZAAR) 50 MG tablet Take 50 mg by mouth daily.     meclizine (ANTIVERT) 25 MG tablet Take 25 mg by mouth as needed (DIZZINESS).      Melatonin 5 MG TABS Take 5 mg by mouth at bedtime as  needed (sleep).     meloxicam (MOBIC) 15 MG tablet TAKE 1 TABLET BY MOUTH ONCE A DAY AS NEEDED 30 tablet 0   Meth-Hyo-M Bl-Na Phos-Ph Sal (URO-MP) 118 MG CAPS TAKE 1 CAPSULE BY MOUTH TWICE DAILY AS NEEDED 20 capsule 0   metoprolol succinate (TOPROL-XL) 25 MG 24 hr tablet TAKE 1/2 TABLET BY MOUTH ONCE A DAY AT BEDTIME 45 tablet 3   metoprolol tartrate (LOPRESSOR) 25 MG tablet Take 12.5 mg by mouth every evening.     MINERAL OIL PO Take by mouth  as needed (2 TABLESPOONS  AS FOR CONSTIPATION).     ondansetron (ZOFRAN) 4 MG tablet Take 1 tablet by mouth 2 times a day, as needed for 7 days 15 tablet 0   pantoprazole (PROTONIX) 40 MG tablet TAKE 1 TABLET BY MOUTH ONCE A DAY 90 90 tablet 3   potassium chloride SA (KLOR-CON) 20 MEQ tablet Take 1 tablet by mouth every 8 hours with food. 10 tablet 0   rosuvastatin (CRESTOR) 10 MG tablet TAKE 1 TABLET (10 MG TOTAL) BY MOUTH DAILY. 30 tablet 11   saccharomyces boulardii (FLORASTOR) 250 MG capsule Take 1 capsule by mouth 2 times a day 60 capsule 1   tiZANidine (ZANAFLEX) 4 MG tablet Take 4 mg by mouth 3 (three) times daily as needed for muscle spasms.     topiramate (TOPAMAX) 25 MG tablet Take 25 mg by mouth daily.     venlafaxine (EFFEXOR) 100 MG tablet Take 100 mg by mouth 2 (two) times daily.     No current facility-administered medications for this visit.    Allergies:   Erythromycin    Social History:  The patient  reports that she has never smoked. She has never used smokeless tobacco. She reports that she does not drink alcohol and does not use drugs.   Family History:  The patient's family history includes Aneurysm in her sister; Breast cancer (age of onset: 16) in her mother; Emphysema in her sister; Heart attack in her mother; Lung cancer in her sister; Prostate cancer in her father; Rheum arthritis in her mother; Stroke in her brother and mother.    ROS:  Please see the history of present illness.   Otherwise, review of systems are positive  for increased stress of late.   All other systems are reviewed and negative.    PHYSICAL EXAM: VS:  BP 96/60   Pulse 69   Ht 5\' 3"  (1.6 m)   Wt 124 lb (56.2 kg)   SpO2 97%   BMI 21.97 kg/m  , BMI Body mass index is 21.97 kg/m. GEN: Well nourished, well developed, in no acute distress HEENT: normal Neck: no JVD, carotid bruits, or masses Cardiac: RRR; no murmurs, rubs, or gallops,no edema  Respiratory:  clear to auscultation bilaterally, normal work of breathing GI: soft, nontender, nondistended, + BS MS: no deformity or atrophy Skin: warm and dry, no rash Neuro:  Strength and sensation are intact Psych: euthymic mood, full affect   EKG:   The ekg ordered today demonstrates NSR, RBBB, LAFB   Recent Labs: 12/01/2019: ALT 24   Lipid Panel    Component Value Date/Time   CHOL 132 12/01/2019 0824   TRIG 113 12/01/2019 0824   HDL 52 12/01/2019 0824   CHOLHDL 2.5 12/01/2019 0824   LDLCALC 60 12/01/2019 0824     Other studies Reviewed: Additional studies/ records that were reviewed today with results demonstrating: labs reviewed, lipids well controlled.   ASSESSMENT AND PLAN:  CAD/coronary calfication: Mild, calcium score 75%ile.  No angina. COntinue aggressive secondary prevention.  HTN: The current medical regimen is effective;  continue present plan and medications.  Healthy diet. Hyperlipidemia: The current medical regimen is effective;  continue present plan and medications. RBBB/LAFB: chronic.  No change.  Family h/o CAD: Sister is my patient who has had several stents, but she is a smoker.   Current medicines are reviewed at length with the patient today.  The patient concerns regarding her medicines were addressed.  The following changes have been  made:  No change  Labs/ tests ordered today include:  No orders of the defined types were placed in this encounter.   Recommend 150 minutes/week of aerobic exercise Low fat, low carb, high fiber diet  recommended  Disposition:   FU in 1 year   Signed, [name], MD  10/20/2020 9:02 AM    [facility] [place], [place], [place]  [address] Phone: [phone]; Fax: [phone]

## 2020-10-20 ENCOUNTER — Encounter: Payer: Self-pay | Admitting: Interventional Cardiology

## 2020-10-20 ENCOUNTER — Ambulatory Visit: Payer: 59 | Admitting: Interventional Cardiology

## 2020-10-20 ENCOUNTER — Other Ambulatory Visit: Payer: Self-pay

## 2020-10-20 VITALS — BP 96/60 | HR 69 | Ht 63.0 in | Wt 124.0 lb

## 2020-10-20 DIAGNOSIS — I2510 Atherosclerotic heart disease of native coronary artery without angina pectoris: Secondary | ICD-10-CM

## 2020-10-20 DIAGNOSIS — I10 Essential (primary) hypertension: Secondary | ICD-10-CM | POA: Diagnosis not present

## 2020-10-20 DIAGNOSIS — I452 Bifascicular block: Secondary | ICD-10-CM | POA: Diagnosis not present

## 2020-10-20 DIAGNOSIS — R109 Unspecified abdominal pain: Secondary | ICD-10-CM | POA: Diagnosis not present

## 2020-10-20 DIAGNOSIS — E782 Mixed hyperlipidemia: Secondary | ICD-10-CM

## 2020-10-20 DIAGNOSIS — Z8249 Family history of ischemic heart disease and other diseases of the circulatory system: Secondary | ICD-10-CM | POA: Diagnosis not present

## 2020-10-20 DIAGNOSIS — Z87448 Personal history of other diseases of urinary system: Secondary | ICD-10-CM | POA: Diagnosis not present

## 2020-10-20 DIAGNOSIS — I2584 Coronary atherosclerosis due to calcified coronary lesion: Secondary | ICD-10-CM | POA: Diagnosis not present

## 2020-10-20 DIAGNOSIS — I700 Atherosclerosis of aorta: Secondary | ICD-10-CM | POA: Diagnosis not present

## 2020-10-20 DIAGNOSIS — N3010 Interstitial cystitis (chronic) without hematuria: Secondary | ICD-10-CM | POA: Diagnosis not present

## 2020-10-20 DIAGNOSIS — R110 Nausea: Secondary | ICD-10-CM | POA: Diagnosis not present

## 2020-10-20 NOTE — Patient Instructions (Signed)
Medication Instructions:  Your physician recommends that you continue on your current medications as directed. Please refer to the Current Medication list given to you today.  *If you need a refill on your cardiac medications before your next appointment, please call your pharmacy*   Lab Work: none If you have labs (blood work) drawn today and your tests are completely normal, you will receive your results only by: [place] (if you have MyChart) OR A paper copy in the mail If you have any lab test that is abnormal or we need to change your treatment, we will call you to review the results.   Testing/Procedures: none   Follow-Up: At [facility], you and your health needs are our priority.  As part of our continuing mission to provide you with exceptional heart care, we have created designated Provider Care Teams.  These Care Teams include your primary Cardiologist (physician) and Advanced Practice Providers (APPs -  Physician Assistants and Nurse Practitioners) who all work together to provide you with the care you need, when you need it.  We recommend signing up for the patient portal called "MyChart".  Sign up information is provided on this After Visit Summary.  MyChart is used to connect with patients for Virtual Visits (Telemedicine).  Patients are able to view lab/test results, encounter notes, upcoming appointments, etc.  Non-urgent messages can be sent to your provider as well.   To learn more about what you can do with MyChart, go to [email or web address].    Your next appointment:   12 month(s)  The format for your next appointment:   In Person  Provider:   You may see Dr Irish Lack or one of the following Advanced Practice Providers on your designated Care Team:   Melina Copa, PA-C Ermalinda Barrios, PA-C   Other Instructions Send [place] a message and let us know if you are taking metoprolol succinate or metoprolol tartrate and the dose you are taking

## 2020-10-24 ENCOUNTER — Telehealth: Payer: Self-pay | Admitting: Interventional Cardiology

## 2020-10-24 NOTE — Telephone Encounter (Signed)
Med list updated.  Patient reports she is not really sleepy but does feel fatigued.  This has been going on for several months. Will forward to Dr Irish Lack to see if he feels Toprol could be contributing to fatigue

## 2020-10-24 NOTE — Telephone Encounter (Signed)
Pt c/o medication issue:  1. Name of Medication: metoprolol succinate (TOPROL-XL) 25 MG 24 hr tablet  2. How are you currently taking this medication (dosage and times per day)? TAKE 1/2 TABLET BY MOUTH ONCE A DAY AT BEDTIME  3. Are you having a reaction (difficulty breathing--STAT)? no  4. What is your medication issue? Patient was in the office on Thursday 07/21 and was told to call back and let Dr. Irish Lack and Fraser Din know what Metoprolol she was taking and how she was taking. Listed above is the medication and how she is currently taking it. She also wants to know if this can make you really sleepy. Please advise.

## 2020-10-25 NOTE — Telephone Encounter (Signed)
   She is on a very low dose of metoprolol.  I doubt it is contributing to fatigue but I am ok if she wants to try stopping the medicine and seeing if she feels better.   Continue to monitor BP at home.  If it increases to > 140/90 consistently, let us know.   Jettie Booze, MD

## 2020-10-25 NOTE — Telephone Encounter (Signed)
Spoke with pt and made her aware of information from Dr. Irish Lack. She is agreeable to a trial off of Metoprolol.  She will contact the office if BP becomes elevated.  I've also asked that she call back if fatigue doesn't improve to see if we may need to put her back on it.  Pt agreeable to plan.

## 2020-10-28 ENCOUNTER — Other Ambulatory Visit: Payer: Self-pay | Admitting: Nurse Practitioner

## 2020-10-28 ENCOUNTER — Other Ambulatory Visit (HOSPITAL_COMMUNITY): Payer: Self-pay

## 2020-10-28 MED ORDER — ROSUVASTATIN CALCIUM 10 MG PO TABS
ORAL_TABLET | Freq: Every day | ORAL | 11 refills | Status: DC
  Filled 2020-10-28: qty 30, fill #0
  Filled 2020-10-28: qty 30, 30d supply, fill #0
  Filled 2020-11-30: qty 30, 30d supply, fill #1
  Filled 2020-11-30: qty 30, 30d supply, fill #0
  Filled 2021-01-04: qty 30, 30d supply, fill #1
  Filled 2021-01-31: qty 30, 30d supply, fill #2
  Filled 2021-03-04: qty 30, 30d supply, fill #3
  Filled 2021-04-01: qty 30, 30d supply, fill #4
  Filled 2021-05-07: qty 30, 30d supply, fill #5
  Filled 2021-05-29: qty 90, 90d supply, fill #6
  Filled 2021-05-30 – 2021-06-02 (×2): qty 30, 30d supply, fill #6
  Filled 2021-06-30: qty 100, 100d supply, fill #7
  Filled 2021-10-01: qty 100, 100d supply, fill #8
  Filled 2021-10-08: qty 20, 20d supply, fill #8

## 2020-10-28 MED FILL — Pantoprazole Sodium EC Tab 40 MG (Base Equiv): ORAL | Qty: 90 | Fill #0 | Status: CN

## 2020-11-21 ENCOUNTER — Other Ambulatory Visit (HOSPITAL_COMMUNITY): Payer: Self-pay

## 2020-11-21 MED ORDER — HYDROCHLOROTHIAZIDE 12.5 MG PO CAPS
12.5000 mg | ORAL_CAPSULE | Freq: Every day | ORAL | 3 refills | Status: DC
  Filled 2020-11-21: qty 90, 90d supply, fill #0
  Filled 2021-02-23: qty 90, 90d supply, fill #1
  Filled 2021-05-19: qty 90, 90d supply, fill #2
  Filled 2021-08-17: qty 90, 90d supply, fill #3

## 2020-11-21 MED ORDER — METOPROLOL SUCCINATE ER 25 MG PO TB24
12.5000 mg | ORAL_TABLET | Freq: Every day | ORAL | 3 refills | Status: DC
  Filled 2020-11-21: qty 45, 90d supply, fill #0
  Filled 2021-02-09: qty 45, 90d supply, fill #1
  Filled 2021-05-07: qty 45, 90d supply, fill #2
  Filled 2021-08-05: qty 45, 90d supply, fill #3

## 2020-11-30 ENCOUNTER — Other Ambulatory Visit (HOSPITAL_COMMUNITY): Payer: Self-pay

## 2020-12-16 ENCOUNTER — Other Ambulatory Visit (HOSPITAL_COMMUNITY): Payer: Self-pay

## 2020-12-16 MED ORDER — LOSARTAN POTASSIUM 50 MG PO TABS
50.0000 mg | ORAL_TABLET | Freq: Every day | ORAL | 3 refills | Status: DC
  Filled 2020-12-16: qty 90, 90d supply, fill #0
  Filled 2021-03-19: qty 90, 90d supply, fill #1
  Filled 2021-06-18: qty 90, 90d supply, fill #2
  Filled 2021-09-11: qty 90, 90d supply, fill #3

## 2020-12-21 ENCOUNTER — Other Ambulatory Visit (HOSPITAL_COMMUNITY): Payer: Self-pay

## 2020-12-21 MED ORDER — TOPIRAMATE 25 MG PO TABS
25.0000 mg | ORAL_TABLET | Freq: Every day | ORAL | 0 refills | Status: DC
  Filled 2020-12-21: qty 90, 90d supply, fill #0

## 2020-12-29 DIAGNOSIS — M25512 Pain in left shoulder: Secondary | ICD-10-CM | POA: Diagnosis not present

## 2021-01-04 ENCOUNTER — Other Ambulatory Visit (HOSPITAL_COMMUNITY): Payer: Self-pay

## 2021-01-19 ENCOUNTER — Other Ambulatory Visit (HOSPITAL_COMMUNITY): Payer: Self-pay

## 2021-01-19 DIAGNOSIS — L814 Other melanin hyperpigmentation: Secondary | ICD-10-CM | POA: Diagnosis not present

## 2021-01-19 DIAGNOSIS — L821 Other seborrheic keratosis: Secondary | ICD-10-CM | POA: Diagnosis not present

## 2021-01-19 DIAGNOSIS — L245 Irritant contact dermatitis due to other chemical products: Secondary | ICD-10-CM | POA: Diagnosis not present

## 2021-01-19 DIAGNOSIS — D225 Melanocytic nevi of trunk: Secondary | ICD-10-CM | POA: Diagnosis not present

## 2021-01-19 DIAGNOSIS — D2272 Melanocytic nevi of left lower limb, including hip: Secondary | ICD-10-CM | POA: Diagnosis not present

## 2021-01-19 DIAGNOSIS — L853 Xerosis cutis: Secondary | ICD-10-CM | POA: Diagnosis not present

## 2021-01-19 MED ORDER — MOMETASONE FUROATE 0.1 % EX CREA
TOPICAL_CREAM | CUTANEOUS | 1 refills | Status: AC
  Filled 2021-01-19: qty 15, 7d supply, fill #0

## 2021-01-24 ENCOUNTER — Other Ambulatory Visit (HOSPITAL_COMMUNITY): Payer: Self-pay

## 2021-01-24 ENCOUNTER — Ambulatory Visit: Payer: 59 | Attending: Orthopedic Surgery

## 2021-01-24 ENCOUNTER — Other Ambulatory Visit: Payer: Self-pay

## 2021-01-24 DIAGNOSIS — M25612 Stiffness of left shoulder, not elsewhere classified: Secondary | ICD-10-CM | POA: Diagnosis not present

## 2021-01-24 DIAGNOSIS — M6281 Muscle weakness (generalized): Secondary | ICD-10-CM | POA: Diagnosis not present

## 2021-01-24 DIAGNOSIS — M25512 Pain in left shoulder: Secondary | ICD-10-CM | POA: Diagnosis not present

## 2021-01-24 DIAGNOSIS — R293 Abnormal posture: Secondary | ICD-10-CM | POA: Insufficient documentation

## 2021-01-24 DIAGNOSIS — G8929 Other chronic pain: Secondary | ICD-10-CM | POA: Diagnosis not present

## 2021-01-25 NOTE — Therapy (Signed)
[place], [place], [address] Phone: [phone]   Fax:  [phone]  Physical Therapy Evaluation  Patient Details  Name: April Rodriguez MRN: [id number] Date of Birth: Sep 08, 1955 Referring Provider (PT): Earlie Server, MD   Encounter Date: 01/24/2021   PT End of Session - 01/25/21 0532     Visit Number 1    Number of Visits 9    Date for PT Re-Evaluation 04/01/21    Authorization Type [place] [employer]    Authorization Time Period assess FOTO on 2nd visit and re-assess on 8th visit    PT Start Time 0851    PT Stop Time 0936    PT Time Calculation (min) 45 min    Activity Tolerance Patient tolerated treatment well    Behavior During Therapy [facility] for tasks assessed/performed             Past Medical History:  Diagnosis Date   AIN grade III    Allergic rhinitis    Anxiety    Chronic constipation    Depression    Family history of adverse reaction to anesthesia    sister gets nauseated   Frequency of urination    GERD (gastroesophageal reflux disease)    Headache    migraines   History of hiatal hernia    Hypertension    IBS (irritable bowel syndrome)    Rash    UNDER BREAST    Past Surgical History:  Procedure Laterality Date   [place] BUNIONECTOMY RIGHT FOOT  03-07-2000   BREAST BIOPSY     CARDIAC CATHETERIZATION N/A 10/21/2015   Procedure: Left Heart Cath and Coronary Angiography;  Surgeon: Jettie Booze, MD;  Location: [place] [facility];  Service: Cardiovascular;  Laterality: N/A;   CATARACT EXTRACTION W/ INTRAOCULAR LENS  IMPLANT, BILATERAL  2011   COLONOSCOPY  12-15-2009   COLONOSCOPY N/A 04/09/2017   Procedure: COLONOSCOPY;  Surgeon: Ronnette Juniper, MD;  Location: [facility];  Service: Gastroenterology;  Laterality: N/A;   COLONOSCOPY WITH PROPOFOL N/A 03/08/2016   Procedure: COLONOSCOPY WITH PROPOFOL;  Surgeon: Garlan Fair, MD;  Location: [facility];  Service:  Endoscopy;  Laterality: N/A;   CYSTO/  BLADDER BX/  HYDRODISTENTION  01-30-2006   ESOPHAGOGASTRODUODENOSCOPY N/A 04/09/2017   Procedure: ESOPHAGOGASTRODUODENOSCOPY (EGD);  Surgeon: Ronnette Juniper, MD;  Location: [facility];  Service: Gastroenterology;  Laterality: N/A;   HEMORROIDECTOMY  1976   LEFT BREAST BX  1980's   BENIGN   RECTAL EXAM UNDER ANESTHESIA N/A 12/10/2014   Procedure: ANAL EXAM UNDER ANESTHESIA  EXCISIONAL BIOPSY OF PERIANAL NEOPLASM;  Surgeon: [name], MD;  Location: [facility];  Service: General;  Laterality: N/A;   TUBAL LIGATION  1980's    There were no vitals filed for this visit.    Subjective Assessment - 01/24/21 0900     Subjective 3 months ago pt reports develop of L shoulder pain c possible MOI from trimmimg shrubs c shears, watering c a heavy hose and bumping her dresser with her L shoulder. Pt reposts her husband passed away eariler this year which has increased the physical demand on her re: household and yard work.    Pertinent History See medical Hx    Diagnostic tests Pt reports x-rays were completed at Dr. Alvester Morin    Currently in Pain? Yes    Pain Score 4    high level of pain 8/10   Pain Location Shoulder    Pain Orientation  Left    Pain Descriptors / Indicators Throbbing;Aching    Pain Type Chronic pain    Pain Radiating Towards lateral upper arm    Pain Onset More than a month ago    Pain Frequency Constant    Aggravating Factors  Sleeping on it, moving it    Pain Relieving Factors minimizing use of L UE. Ice pack, voltaren, arthritis meds helped only a little                [facility] PT Assessment - 01/25/21 0001       Assessment   Medical Diagnosis L shoulder pain    Referring Provider (PT) Earlie Server, MD    Onset Date/Surgical Date --   approx 3 months   Hand Dominance Right    Prior Therapy no      Precautions   Precautions None      Restrictions   Weight Bearing Restrictions No      Balance Screen    Has the patient fallen in the past 6 months No      Home Environment   Additional Comments Pt denies issues with accessing or mobility within home      Prior Function   Level of Independence Independent    Vocation Full time employment    Vocation Requirements Office/computer-desk top    Leisure house and yard work      Cognition   Overall Cognitive Status Within Functional Limits for tasks assessed      Observation/Other Assessments   Focus on Therapeutic Outcomes (FOTO)  TBA next visit      Sensation   Light Touch Appears Intact      Posture/Postural Control   Posture/Postural Control Postural limitations    Postural Limitations Rounded Shoulders;Forward head;Increased thoracic kyphosis      ROM / Strength   AROM / PROM / Strength AROM;Strength      AROM   AROM Assessment Site Shoulder    Right/Left Shoulder Right;Left    Right Shoulder Flexion 130 Degrees    Right Shoulder ABduction 150 Degrees    Left Shoulder Flexion 115 Degrees    Left Shoulder ABduction 113 Degrees      Strength   Overall Strength Comments Strength testing did not provoke pt's l shoulder pain    Strength Assessment Site Shoulder    Right/Left Shoulder Left    Left Shoulder Flexion 4/5    Left Shoulder Extension 4+/5    Left Shoulder ABduction 4/5    Left Shoulder Internal Rotation 4+/5    Left Shoulder External Rotation 4/5      Palpation   Palpation comment TTP lateral GH jt area      Special Tests    Special Tests Rotator Cuff Impingement    Rotator Cuff Impingment tests Michel Bickers test;Empty Can test;Full Can test      Hawkins-Kennedy test   Findings Positive    Side Left      Empty Can test   Findings Negative    Side Left      Full Can test   Findings Negative    Side Left                        Objective measurements completed on examination: See above findings.                PT Education - 01/25/21 0530     Education Details Eval  findings, POC, HEP for posture  and posterior chain strengthening, sleeping positions and support for more comfort and restful sleep    Person(s) Educated Patient    Methods Explanation;Demonstration;Tactile cues;Verbal cues;Handout    Comprehension Verbalized understanding;Returned demonstration;Verbal cues required;Tactile cues required              PT Short Term Goals - 01/25/21 0549       PT SHORT TERM GOAL #1   Title Pt will be Ind in an initial HEP for the L shoulder    Baseline started on eval    Status New    Target Date 02/15/21      PT SHORT TERM GOAL #2   Title Pt will voice measures to assist in decreasing L shoulder pain    Baseline decreased understanding    Status New    Target Date 02/15/21               PT Long Term Goals - 01/25/21 0557       PT LONG TERM GOAL #1   Title Pt will report a decrease in L shoulder pain with daily activities and sleeping to 2/10 or less for improved functional use and more restful sleep    Baseline 4-8/10    Status New    Target Date 04/01/21      PT LONG TERM GOAL #2   Title Increase pt's L shoulder flexion ROM to 125d and abd ROM to 140d for improved L shoulder overhead reaching    Status New    Target Date 04/01/21      PT LONG TERM GOAL #3   Title Pt will demonstrate L shoulder strength of at least 4+/5 for improve L shoulder function    Baseline See flowsheets    Status New    Target Date 04/01/21      PT LONG TERM GOAL #4   Title Increase pt's posterior strength for improved sitting and standing posture with minimized rounded shoulders and thoracic kyphosis    Baseline postural dysfunction    Status New    Target Date 04/01/21      PT LONG TERM GOAL #5   Title Pt ill be ind in a final HEP to mantain achieved LOF    Baseline started on eval    Status New    Target Date 04/01/21                    Plan - 01/25/21 0535     Clinical Impression Statement Pt presents to PT with chronic L  shoulder of approx. 3 months. Eval reveal pain at the end range of of L shoulder flexion and abd which were both decreased in ROM, postural dysfunction of forward head, rounded shoulders, CT step off, and thoracic kyphosis, and no overt L shoulder weakness or pain with resistive strength testing. Signs and symptoms appear consistent with impingement syndrome. See Education for initial PT intervention. Pt will benefit from skilled PT to restore deficits, decrease pain, and optimize functional use of the L shoulder/UE and QOL.    Personal Factors and Comorbidities Time since onset of injury/illness/exacerbation;Comorbidity 1    Comorbidities see medical Hx    Examination-Activity Limitations Reach Overhead    Examination-Participation Restrictions Yard Work    Stability/Clinical Decision Making Stable/Uncomplicated    Clinical Decision Making Low    Rehab Potential Good    PT Frequency 1x / week    PT Duration 8 weeks    PT Treatment/Interventions ADLs/Self Care Home Management;Cryotherapy;Electrical Stimulation;Iontophoresis  4mg /ml Dexamethasone;Moist Heat;Neuromuscular re-education;Therapeutic exercise;Patient/family education;Manual techniques;Joint Manipulations;Dry needling;Taping;Vasopneumatic Device    PT Next Visit Plan Complete FOTO, assess [place] jt stability, assess response to HEP, progress ther ex and modalitiy use as indicated    PT Home Exercise Plan [id number]    Consulted and Agree with Plan of Care Patient             Patient will benefit from skilled therapeutic intervention in order to improve the following deficits and impairments:  Decreased range of motion, Impaired UE functional use, Decreased activity tolerance, Pain, Postural dysfunction  Visit Diagnosis: Chronic left shoulder pain  Decreased ROM of left shoulder  Abnormal posture  Muscle weakness (generalized)     Problem List Patient Active Problem List   Diagnosis Date Noted   Precordial pain    Persistent  disorder of initiating or maintaining sleep 06/28/2011    Gar Ponto, PT 01/25/2021, 6:16 AM  [facility] [address] [place], [place], [address] Phone: [phone]   Fax:  [phone]  Name: ROYALE LENNARTZ MRN: [id number] Date of Birth: May 27, 1955

## 2021-02-01 ENCOUNTER — Other Ambulatory Visit (HOSPITAL_COMMUNITY): Payer: Self-pay

## 2021-02-02 ENCOUNTER — Other Ambulatory Visit: Payer: Self-pay

## 2021-02-02 ENCOUNTER — Ambulatory Visit: Payer: 59 | Attending: Orthopedic Surgery

## 2021-02-02 DIAGNOSIS — G8929 Other chronic pain: Secondary | ICD-10-CM | POA: Insufficient documentation

## 2021-02-02 DIAGNOSIS — M25612 Stiffness of left shoulder, not elsewhere classified: Secondary | ICD-10-CM | POA: Diagnosis not present

## 2021-02-02 DIAGNOSIS — M25512 Pain in left shoulder: Secondary | ICD-10-CM | POA: Insufficient documentation

## 2021-02-02 DIAGNOSIS — R293 Abnormal posture: Secondary | ICD-10-CM | POA: Diagnosis not present

## 2021-02-02 DIAGNOSIS — M6281 Muscle weakness (generalized): Secondary | ICD-10-CM | POA: Insufficient documentation

## 2021-02-02 NOTE — Therapy (Signed)
[place], [place], [address] Phone: [phone]   Fax:  [phone]  Physical Therapy Treatment  Patient Details  Name: April Rodriguez MRN: [id number] Date of Birth: Oct 17, 1955 Referring Provider (PT): Earlie Server, MD   Encounter Date: 02/02/2021   PT End of Session - 02/02/21 2255     Visit Number 2    Number of Visits 9    Date for PT Re-Evaluation 04/01/21    Authorization Type [place] [employer]    Authorization Time Period assess FOTO on 3rd visit and re-assess on 8th visit    PT Start Time 1335    PT Stop Time 1420    PT Time Calculation (min) 45 min    Activity Tolerance Patient tolerated treatment well    Behavior During Therapy WFL for tasks assessed/performed             Past Medical History:  Diagnosis Date   AIN grade III    Allergic rhinitis    Anxiety    Chronic constipation    Depression    Family history of adverse reaction to anesthesia    sister gets nauseated   Frequency of urination    GERD (gastroesophageal reflux disease)    Headache    migraines   History of hiatal hernia    Hypertension    IBS (irritable bowel syndrome)    Rash    UNDER BREAST    Past Surgical History:  Procedure Laterality Date   [place] BUNIONECTOMY RIGHT FOOT  03-07-2000   BREAST BIOPSY     CARDIAC CATHETERIZATION N/A 10/21/2015   Procedure: Left Heart Cath and Coronary Angiography;  Surgeon: Jettie Booze, MD;  Location: [facility];  Service: Cardiovascular;  Laterality: N/A;   CATARACT EXTRACTION W/ INTRAOCULAR LENS  IMPLANT, BILATERAL  2011   COLONOSCOPY  12-15-2009   COLONOSCOPY N/A 04/09/2017   Procedure: COLONOSCOPY;  Surgeon: Ronnette Juniper, MD;  Location: [facility];  Service: Gastroenterology;  Laterality: N/A;   COLONOSCOPY WITH PROPOFOL N/A 03/08/2016   Procedure: COLONOSCOPY WITH PROPOFOL;  Surgeon: Garlan Fair, MD;  Location: [facility];  Service: Endoscopy;   Laterality: N/A;   CYSTO/  BLADDER BX/  HYDRODISTENTION  01-30-2006   ESOPHAGOGASTRODUODENOSCOPY N/A 04/09/2017   Procedure: ESOPHAGOGASTRODUODENOSCOPY (EGD);  Surgeon: Ronnette Juniper, MD;  Location: [facility];  Service: Gastroenterology;  Laterality: N/A;   HEMORROIDECTOMY  1976   LEFT BREAST BX  1980's   BENIGN   RECTAL EXAM UNDER ANESTHESIA N/A 12/10/2014   Procedure: ANAL EXAM UNDER ANESTHESIA  EXCISIONAL BIOPSY OF PERIANAL NEOPLASM;  Surgeon: [name], MD;  Location: [facility];  Service: General;  Laterality: N/A;   TUBAL LIGATION  1980's    There were no vitals filed for this visit.   Subjective Assessment - 02/02/21 1340     Subjective Pt reports the sleeping recommendations for positioning and support have been helpful. Pt notes her L shoulder pain is less and less frequent.    Pertinent History See medical Hx    Diagnostic tests Pt reports x-rays were completed at Dr. Alvester Morin    Currently in Pain? Yes    Pain Score 0-No pain   certain movement 5/18   Pain Location Shoulder    Pain Orientation Left    Pain Descriptors / Indicators Aching;Throbbing    Pain Type Chronic pain    Pain Radiating Towards lateral upper arm    Pain Onset More than a  month ago    Pain Frequency Intermittent    Aggravating Factors  Sleeping on it, moving it    Pain Relieving Factors HEP    Multiple Pain Sites No              [facility] Adult PT Treatment/Exercise:  Therapeutic Exercise: - shoulder ext, x15, RTB - shoulder row, x15, RTB - shoulder ER, x15, RTB - serratus punch, x15, RTB - Upper trap stretch c GH jt distraction, x3, 20 sec  Manual Therapy: - R GH jt Inferior distraction and AP mobs, grade lll  Neuromuscular re-ed: -   Therapeutic Activity: -                             PT Education - 02/02/21 2252     Education Details Updated HEP for periscapular and rotator cuff strengthening. Instructed pt in proper computer set up for  minimized shoulder strain.    Person(s) Educated Patient    Methods Explanation;Demonstration;Tactile cues;Verbal cues    Comprehension Verbalized understanding;Returned demonstration;Verbal cues required;Tactile cues required;Need further instruction              PT Short Term Goals - 01/25/21 0549       PT SHORT TERM GOAL #1   Title Pt will be Ind in an initial HEP for the L shoulder    Baseline started on eval    Status New    Target Date 02/15/21      PT SHORT TERM GOAL #2   Title Pt will voice measures to assist in decreasing L shoulder pain    Baseline decreased understanding    Status New    Target Date 02/15/21               PT Long Term Goals - 01/25/21 0557       PT LONG TERM GOAL #1   Title Pt will report a decrease in L shoulder pain with daily activities and sleeping to 2/10 or less for improved functional use and more restful sleep    Baseline 4-8/10    Status New    Target Date 04/01/21      PT LONG TERM GOAL #2   Title Increase pt's L shoulder flexion ROM to 125d and abd ROM to 140d for improved L shoulder overhead reaching    Status New    Target Date 04/01/21      PT LONG TERM GOAL #3   Title Pt will demonstrate L shoulder strength of at least 4+/5 for improve L shoulder function    Baseline See flowsheets    Status New    Target Date 04/01/21      PT LONG TERM GOAL #4   Title Increase pt's posterior strength for improved sitting and standing posture with minimized rounded shoulders and thoracic kyphosis    Baseline postural dysfunction    Status New    Target Date 04/01/21      PT LONG TERM GOAL #5   Title Pt ill be ind in a final HEP to mantain achieved LOF    Baseline started on eval    Status New    Target Date 04/01/21                   Plan - 02/02/21 2256     Clinical Impression Statement Pt has responded well with decreased L shoulder pain to the initial HEP and education re: sleeping positions. Pt  reports  consistent completion of her HEP. PT was completed for L GH jt mobs c tight posterior capsule determined. Pt was instructed in and completed additional ther ex for periscapular strengthening with the serratus and for the RCs with shoulder ERs. A self L GH jt distraction ex was also completed and added to the pt's HEP. Education for proper computer station set up was provided, See Education section. pt tolerated today's PT session without adverse effects.    Personal Factors and Comorbidities Time since onset of injury/illness/exacerbation;Comorbidity 1    Comorbidities see medical Hx    Examination-Activity Limitations Reach Overhead    Examination-Participation Restrictions Yard Work    Stability/Clinical Decision Making Stable/Uncomplicated    Clinical Decision Making Low    Rehab Potential Good    PT Frequency 1x / week    PT Duration 8 weeks    PT Treatment/Interventions ADLs/Self Care Home Management;Cryotherapy;Electrical Stimulation;Iontophoresis 4mg /ml Dexamethasone;Moist Heat;Neuromuscular re-education;Therapeutic exercise;Patient/family education;Manual techniques;Joint Manipulations;Dry needling;Taping;Vasopneumatic Device    PT Next Visit Plan Complete FOTO, assess [place] jt stability, assess response to HEP, progress ther ex and modalitiy use as indicated    PT Home Exercise Plan [id number]             Patient will benefit from skilled therapeutic intervention in order to improve the following deficits and impairments:  Decreased range of motion, Impaired UE functional use, Decreased activity tolerance, Pain, Postural dysfunction  Visit Diagnosis: Chronic left shoulder pain  Decreased ROM of left shoulder  Abnormal posture  Muscle weakness (generalized)     Problem List Patient Active Problem List   Diagnosis Date Noted   Precordial pain    Persistent disorder of initiating or maintaining sleep 06/28/2011    Gar Ponto MS, PT 02/02/21 11:14 PM   [facility] [address] [place], [place], [address] Phone: [phone]   Fax:  [phone]  Name: April Rodriguez MRN: [id number] Date of Birth: 1955/11/22

## 2021-02-07 ENCOUNTER — Encounter: Payer: 59 | Admitting: Physical Therapy

## 2021-02-08 ENCOUNTER — Ambulatory Visit: Payer: 59

## 2021-02-10 ENCOUNTER — Other Ambulatory Visit (HOSPITAL_COMMUNITY): Payer: Self-pay

## 2021-02-15 ENCOUNTER — Ambulatory Visit: Payer: 59

## 2021-02-15 ENCOUNTER — Other Ambulatory Visit: Payer: Self-pay

## 2021-02-15 DIAGNOSIS — G8929 Other chronic pain: Secondary | ICD-10-CM | POA: Diagnosis not present

## 2021-02-15 DIAGNOSIS — R293 Abnormal posture: Secondary | ICD-10-CM

## 2021-02-15 DIAGNOSIS — M25612 Stiffness of left shoulder, not elsewhere classified: Secondary | ICD-10-CM

## 2021-02-15 DIAGNOSIS — M6281 Muscle weakness (generalized): Secondary | ICD-10-CM | POA: Diagnosis not present

## 2021-02-15 DIAGNOSIS — M25512 Pain in left shoulder: Secondary | ICD-10-CM | POA: Diagnosis not present

## 2021-02-15 NOTE — Therapy (Signed)
[place], [place], [address] Phone: [phone]   Fax:  [phone]  Physical Therapy Treatment  Patient Details  Name: April Rodriguez MRN: [id number] Date of Birth: Apr 27, 1955 Referring Provider (PT): Earlie Server, MD   Encounter Date: 02/15/2021   PT End of Session - 02/15/21 0806     Visit Number 3    Number of Visits 9    Date for PT Re-Evaluation 04/01/21    Authorization Type [facility] [place] [employer], 25 vists    Authorization Time Period --    PT Start Time 0804    PT Stop Time 0844    PT Time Calculation (min) 40 min    Activity Tolerance Patient tolerated treatment well    Behavior During Therapy [facility] for tasks assessed/performed             Past Medical History:  Diagnosis Date   AIN grade III    Allergic rhinitis    Anxiety    Chronic constipation    Depression    Family history of adverse reaction to anesthesia    sister gets nauseated   Frequency of urination    GERD (gastroesophageal reflux disease)    Headache    migraines   History of hiatal hernia    Hypertension    IBS (irritable bowel syndrome)    Rash    UNDER BREAST    Past Surgical History:  Procedure Laterality Date   [place] BUNIONECTOMY RIGHT FOOT  03-07-2000   BREAST BIOPSY     CARDIAC CATHETERIZATION N/A 10/21/2015   Procedure: Left Heart Cath and Coronary Angiography;  Surgeon: Jettie Booze, MD;  Location: [facility];  Service: Cardiovascular;  Laterality: N/A;   CATARACT EXTRACTION W/ INTRAOCULAR LENS  IMPLANT, BILATERAL  2011   COLONOSCOPY  12-15-2009   COLONOSCOPY N/A 04/09/2017   Procedure: COLONOSCOPY;  Surgeon: Ronnette Juniper, MD;  Location: [facility];  Service: Gastroenterology;  Laterality: N/A;   COLONOSCOPY WITH PROPOFOL N/A 03/08/2016   Procedure: COLONOSCOPY WITH PROPOFOL;  Surgeon: Garlan Fair, MD;  Location: [facility];  Service: Endoscopy;  Laterality: N/A;   CYSTO/  BLADDER  BX/  HYDRODISTENTION  01-30-2006   ESOPHAGOGASTRODUODENOSCOPY N/A 04/09/2017   Procedure: ESOPHAGOGASTRODUODENOSCOPY (EGD);  Surgeon: Ronnette Juniper, MD;  Location: [facility];  Service: Gastroenterology;  Laterality: N/A;   HEMORROIDECTOMY  1976   LEFT BREAST BX  1980's   BENIGN   RECTAL EXAM UNDER ANESTHESIA N/A 12/10/2014   Procedure: ANAL EXAM UNDER ANESTHESIA  EXCISIONAL BIOPSY OF PERIANAL NEOPLASM;  Surgeon: [name], MD;  Location: [facility];  Service: General;  Laterality: N/A;   TUBAL LIGATION  1980's    There were no vitals filed for this visit.   Subjective Assessment - 02/15/21 0809     Subjective Pt reports the L shoulder is a little more sore. She states not feeling well last week and she was not as consistent with her HEP.    Diagnostic tests Pt reports x-rays were completed at Dr. Alvester Morin    Currently in Pain? Yes    Pain Score 1    0-2   Pain Location Shoulder    Pain Orientation Left    Pain Descriptors / Indicators Aching    Pain Type Chronic pain    Pain Radiating Towards lateral upper arm    Pain Onset More than a month ago    Pain Frequency Intermittent    Aggravating Factors  Sleeping on it, moving it    Pain Relieving Factors HEP                     [facility] Adult PT Treatment/Exercise:   Therapeutic Exercise: - UBE, 4', 2" forward and backward - shoulder ext, x15, RTB - shoulder row, x15, RTB - shoulder ER, x15, RTB - serratus punch, x15, RTB - Shoulder flexion to 90d, 2x10, 2# - Upper trap stretch c GH jt distraction, x3, 20 sec   Manual Therapy: - R GH jt Inferior distraction and AP mobs, grade lll   Neuromuscular re-ed: - NA   Therapeutic Activity: - NA                       PT Short Term Goals - 02/15/21 2230       PT SHORT TERM GOAL #1   Title Pt will be Ind in an initial HEP for the L shoulder    Baseline started on eval    Status Achieved    Target Date 02/15/21      PT SHORT TERM  GOAL #2   Title Pt will voice measures to assist in decreasing L shoulder pain    Baseline decreased understanding    Status Achieved    Target Date 02/15/21               PT Long Term Goals - 01/25/21 0557       PT LONG TERM GOAL #1   Title Pt will report a decrease in L shoulder pain with daily activities and sleeping to 2/10 or less for improved functional use and more restful sleep    Baseline 4-8/10    Status New    Target Date 04/01/21      PT LONG TERM GOAL #2   Title Increase pt's L shoulder flexion ROM to 125d and abd ROM to 140d for improved L shoulder overhead reaching    Status New    Target Date 04/01/21      PT LONG TERM GOAL #3   Title Pt will demonstrate L shoulder strength of at least 4+/5 for improve L shoulder function    Baseline See flowsheets    Status New    Target Date 04/01/21      PT LONG TERM GOAL #4   Title Increase pt's posterior strength for improved sitting and standing posture with minimized rounded shoulders and thoracic kyphosis    Baseline postural dysfunction    Status New    Target Date 04/01/21      PT LONG TERM GOAL #5   Title Pt ill be ind in a final HEP to mantain achieved LOF    Baseline started on eval    Status New    Target Date 04/01/21                   Plan - 02/15/21 0806     Clinical Impression Statement PT was completed for L GH jt mobs for posterior capsule tightness and for strenghtening of the L RC and periscapular muscles. Pt demonstrates proper understanding of her HEP and is using her HEP and sleeping recommendations to help reduce the pain. Pt is making appropriate progress with her L shoulder with her reporting a sigificant decrease in L shoulder pain from 4-8/10 to 0-2/10. Pt is able to flex and abd her L shoulder to endrange without provovation of L shoulder pain.    Personal Factors and  Comorbidities Time since onset of injury/illness/exacerbation;Comorbidity 1    Comorbidities see medical Hx     Examination-Activity Limitations Reach Overhead    Examination-Participation Restrictions Yard Work    Stability/Clinical Decision Making Stable/Uncomplicated    Clinical Decision Making Low    Rehab Potential Good    PT Frequency 1x / week    PT Duration 8 weeks    PT Treatment/Interventions ADLs/Self Care Home Management;Cryotherapy;Electrical Stimulation;Iontophoresis 4mg /ml Dexamethasone;Moist Heat;Neuromuscular re-education;Therapeutic exercise;Patient/family education;Manual techniques;Joint Manipulations;Dry needling;Taping;Vasopneumatic Device    PT Next Visit Plan Progress ther ex and modalitiy use as indicated    PT Home Exercise Plan [id number]    Consulted and Agree with Plan of Care Patient             Patient will benefit from skilled therapeutic intervention in order to improve the following deficits and impairments:  Decreased range of motion, Impaired UE functional use, Decreased activity tolerance, Pain, Postural dysfunction  Visit Diagnosis: Chronic left shoulder pain  Abnormal posture  Muscle weakness (generalized)  Decreased ROM of left shoulder     Problem List Patient Active Problem List   Diagnosis Date Noted   Precordial pain    Persistent disorder of initiating or maintaining sleep 06/28/2011   Gar Ponto MS, PT 02/15/21 10:50 PM   [facility] [address] [place], [place], [address] Phone: [phone]   Fax:  [phone]  Name: April Rodriguez MRN: [id number] Date of Birth: 10-19-1955

## 2021-02-16 ENCOUNTER — Other Ambulatory Visit: Payer: Self-pay

## 2021-02-16 ENCOUNTER — Ambulatory Visit
Admission: EM | Admit: 2021-02-16 | Discharge: 2021-02-16 | Disposition: A | Discharge status: Home / Self Care | Payer: 59 | Attending: Physician Assistant | Admitting: Physician Assistant

## 2021-02-16 ENCOUNTER — Other Ambulatory Visit (HOSPITAL_COMMUNITY): Payer: Self-pay

## 2021-02-16 DIAGNOSIS — J101 Influenza due to other identified influenza virus with other respiratory manifestations: Secondary | ICD-10-CM

## 2021-02-16 LAB — POCT INFLUENZA A/B
Influenza A, POC: POSITIVE — AB
Influenza B, POC: NEGATIVE

## 2021-02-16 MED ORDER — OSELTAMIVIR PHOSPHATE 75 MG PO CAPS
75.0000 mg | ORAL_CAPSULE | Freq: Two times a day (BID) | ORAL | 0 refills | Status: DC
  Filled 2021-02-16: qty 10, 5d supply, fill #0

## 2021-02-16 NOTE — ED Provider Notes (Signed)
[facility]    CSN: [id number] Arrival date & time: 02/16/21  0803      History   Chief Complaint Chief Complaint  Patient presents with   Cough    HPI April Rodriguez is a 65 y.o. female.   Patient here today for evaluation of nasal congestion and drainage, cough, sore throat that started about a week ago.  She reports that she did just get a fever yesterday.  She has not had fevers before this.  She has had exposure to flu.  She has tried over-the-counter medications with mild relief.  The history is provided by the patient.  Cough Associated symptoms: fever and sore throat   Associated symptoms: no ear pain, no eye discharge, no shortness of breath and no wheezing    Past Medical History:  Diagnosis Date   AIN grade III    Allergic rhinitis    Anxiety    Chronic constipation    Depression    Family history of adverse reaction to anesthesia    sister gets nauseated   Frequency of urination    GERD (gastroesophageal reflux disease)    Headache    migraines   History of hiatal hernia    Hypertension    IBS (irritable bowel syndrome)    Rash    UNDER BREAST    Patient Active Problem List   Diagnosis Date Noted   Precordial pain    Persistent disorder of initiating or maintaining sleep 06/28/2011    Past Surgical History:  Procedure Laterality Date   AUSTIN BUNIONECTOMY RIGHT FOOT  03-07-2000   BREAST BIOPSY     CARDIAC CATHETERIZATION N/A 10/21/2015   Procedure: Left Heart Cath and Coronary Angiography;  Surgeon: Jettie Booze, MD;  Location: [facility];  Service: Cardiovascular;  Laterality: N/A;   CATARACT EXTRACTION W/ INTRAOCULAR LENS  IMPLANT, BILATERAL  2011   COLONOSCOPY  12-15-2009   COLONOSCOPY N/A 04/09/2017   Procedure: COLONOSCOPY;  Surgeon: Ronnette Juniper, MD;  Location: [facility];  Service: Gastroenterology;  Laterality: N/A;   COLONOSCOPY WITH PROPOFOL N/A 03/08/2016   Procedure: COLONOSCOPY WITH PROPOFOL;  Surgeon:  Garlan Fair, MD;  Location: [facility];  Service: Endoscopy;  Laterality: N/A;   CYSTO/  BLADDER BX/  HYDRODISTENTION  01-30-2006   ESOPHAGOGASTRODUODENOSCOPY N/A 04/09/2017   Procedure: ESOPHAGOGASTRODUODENOSCOPY (EGD);  Surgeon: Ronnette Juniper, MD;  Location: [facility];  Service: Gastroenterology;  Laterality: N/A;   HEMORROIDECTOMY  1976   LEFT BREAST BX  1980's   BENIGN   RECTAL EXAM UNDER ANESTHESIA N/A 12/10/2014   Procedure: ANAL EXAM UNDER ANESTHESIA  EXCISIONAL BIOPSY OF PERIANAL NEOPLASM;  Surgeon: [name], MD;  Location: [facility];  Service: General;  Laterality: N/A;   TUBAL LIGATION  1980's    OB History   No obstetric history on file.      Home Medications    Prior to Admission medications   Medication Sig Start Date End Date Taking? Authorizing Provider  oseltamivir (TAMIFLU) 75 MG capsule Take 1 capsule (75 mg total) by mouth every 12 (twelve) hours. 02/16/21  Yes Francene Finders, PA-C  aspirin EC 81 MG tablet Take 1 tablet (81 mg total) by mouth daily. 03/02/19   Burtis Junes, NP  aspirin-acetaminophen-caffeine (EXCEDRIN MIGRAINE) [phone] MG tablet Take 1 tablet by mouth daily as needed for headache.    [provider]  busPIRone (BUSPAR) 5 MG tablet Take 2.5 mg by mouth daily.  [provider]  clotrimazole-betamethasone (LOTRISONE) cream Apply 1 application topically as needed (rash).     [provider]  docusate sodium (COLACE) 100 MG capsule Take 100 mg by mouth 2 (two) times daily as needed for mild constipation.    [provider]  Estradiol 10 MCG TABS vaginal tablet INSERT 1 TABLET 2 TIMES A WEEK 03/29/20 03/29/21  Avon Gully, NP  Estradiol 10 MCG TABS vaginal tablet Place 1 tablet (10 mcg total) vaginally 2 (two) times a week. 07/04/20   Avon Gully, NP  Estradiol 10 MCG TABS vaginal tablet Insert 1 tablet twice a week by vaginal route as directed for 30 days. 07/14/20     FIBER PO  Take 1 capsule by mouth daily as needed (constipation).    [provider]  fluticasone (FLONASE) 50 MCG/ACT nasal spray Place 2 sprays daily into both nostrils. 02/10/17   Evelina Dun A, FNP  hydrochlorothiazide (MICROZIDE) 12.5 MG capsule Take 12.5 mg by mouth daily.    [provider]  hydrochlorothiazide (MICROZIDE) 12.5 MG capsule Take 1 capsule (12.5 mg total) by mouth daily. 11/21/20     hydrOXYzine (ATARAX/VISTARIL) 25 MG tablet TAKE 1 TABLET BY MOUTH EVERY NIGHT AT BEDTIME AS NEEDED FOR BLADDER PAIN. 04/12/20 04/12/21  Hollace Hayward, NP  hyoscyamine (OSCIMIN SR) 0.375 MG 12 hr tablet Take 1 tablet (0.375 mg total) by mouth daily. 09/29/20     ibuprofen (ADVIL,MOTRIN) 200 MG tablet Take 200-400 mg by mouth daily as needed for headache or moderate pain.    [provider]  levocetirizine (XYZAL) 5 MG tablet Take 5 mg by mouth daily as needed for allergies. If Claritin doesn't work    [provider]  loratadine (CLARITIN) 10 MG tablet Take 10 mg by mouth daily as needed for allergies.    [provider]  losartan (COZAAR) 50 MG tablet Take 50 mg by mouth daily.    [provider]  losartan (COZAAR) 50 MG tablet Take 1 tablet (50 mg total) by mouth daily. 12/16/20     meclizine (ANTIVERT) 25 MG tablet Take 25 mg by mouth as needed (DIZZINESS).     [provider]  Melatonin 5 MG TABS Take 5 mg by mouth at bedtime as needed (sleep).    [provider]  meloxicam (MOBIC) 15 MG tablet TAKE 1 TABLET BY MOUTH ONCE A DAY AS NEEDED 04/14/20 04/14/21  Chadwell, Vonna Kotyk, PA-C  Meth-Hyo-M Bl-Na Phos-Ph Sal (URO-MP) 118 MG CAPS TAKE 1 CAPSULE BY MOUTH TWICE DAILY AS NEEDED 02/24/20 02/23/21  Bjorn Loser, MD  metoprolol succinate (TOPROL-XL) 25 MG 24 hr tablet Take 0.5 tablets (12.5 mg total) by mouth at bedtime. 11/21/20     MINERAL OIL PO Take by mouth as needed (2 TABLESPOONS  AS FOR CONSTIPATION).    [provider]   mometasone (ELOCON) 0.1 % cream Apply a small amount to affected area twice a day 01/19/21     ondansetron (ZOFRAN) 4 MG tablet Take 1 tablet by mouth 2 times a day, as needed for 7 days 09/13/20     pantoprazole (PROTONIX) 40 MG tablet TAKE 1 TABLET BY MOUTH ONCE A DAY 90 12/03/19 12/02/20  Leeroy Cha, MD  potassium chloride SA (KLOR-CON) 20 MEQ tablet Take 1 tablet by mouth every 8 hours with food. 09/19/20     rosuvastatin (CRESTOR) 10 MG tablet TAKE 1 TABLET (10 MG TOTAL) BY MOUTH DAILY. 10/28/20 10/28/21  Jettie Booze, MD  saccharomyces boulardii [facility])  250 MG capsule Take 1 capsule by mouth 2 times a day 09/13/20     tiZANidine (ZANAFLEX) 4 MG tablet Take 4 mg by mouth 3 (three) times daily as needed for muscle spasms.    [provider]  topiramate (TOPAMAX) 25 MG tablet Take 25 mg by mouth daily.    [provider]  topiramate (TOPAMAX) 25 MG tablet Take 1 tablet (25 mg total) by mouth daily. 12/21/20     venlafaxine (EFFEXOR) 100 MG tablet Take 100 mg by mouth 2 (two) times daily.    [provider]    Family History Family History  Problem Relation Age of Onset   Rheum arthritis Mother    Breast cancer Mother 52   Heart attack Mother    Stroke Mother    Prostate cancer Father    Emphysema Sister    Stroke Brother    Lung cancer Sister        Arlice Colt   Aneurysm Sister        brain    Social History Social History   Tobacco Use   Smoking status: Never   Smokeless tobacco: Never  Substance Use Topics   Alcohol use: No   Drug use: No     Allergies   Erythromycin   Review of Systems Review of Systems  Constitutional:  Positive for fever.  HENT:  Positive for congestion, sinus pressure and sore throat. Negative for ear pain.   Eyes:  Negative for discharge and redness.  Respiratory:  Positive for cough. Negative for shortness of breath and wheezing.   Gastrointestinal:  Negative for abdominal pain, diarrhea, nausea  and vomiting.    Physical Exam Triage Vital Signs ED Triage Vitals  Enc Vitals Group     BP 02/16/21 0825 118/77     Pulse Rate 02/16/21 0825 86     Resp 02/16/21 0825 18     Temp 02/16/21 0825 99.3 F (37.4 C)     Temp Source 02/16/21 0825 Oral     SpO2 02/16/21 0825 97 %     Weight --      Height --      Head Circumference --      Peak Flow --      Pain Score 02/16/21 0826 0     Pain Loc --      Pain Edu? --      Excl. in [place]? --    No data found.  Updated Vital Signs BP 118/77 (BP Location: Left Arm)   Pulse 86   Temp 99.3 F (37.4 C) (Oral)   Resp 18   SpO2 97%     Physical Exam Vitals and nursing note reviewed.  Constitutional:      General: She is not in acute distress.    Appearance: Normal appearance. She is not ill-appearing.  HENT:     Head: Normocephalic and atraumatic.     Right Ear: Tympanic membrane normal.     Left Ear: Tympanic membrane normal.     Nose: Congestion present.     Mouth/Throat:     Mouth: Mucous membranes are moist.     Pharynx: No oropharyngeal exudate or posterior oropharyngeal erythema.     Comments: PND noted Eyes:     Conjunctiva/sclera: Conjunctivae normal.  Cardiovascular:     Rate and Rhythm: Normal rate and regular rhythm.     Heart sounds: Normal heart sounds. No murmur heard. Pulmonary:     Effort: Pulmonary effort is normal.  No respiratory distress.     Breath sounds: Normal breath sounds. No wheezing, rhonchi or rales.  Skin:    General: Skin is warm and dry.  Neurological:     Mental Status: She is alert.  Psychiatric:        Mood and Affect: Mood normal.        Thought Content: Thought content normal.     UC Treatments / Results  Labs (all labs ordered are listed, but only abnormal results are displayed) Labs Reviewed  POCT INFLUENZA A/B - Abnormal; Notable for the following components:      Result Value   Influenza A, POC Positive (*)    All other components within normal limits     EKG   Radiology No results found.  Procedures Procedures (including critical care time)  Medications Ordered in UC Medications - No data to display  Initial Impression / Assessment and Plan / UC Course  I have reviewed the triage vital signs and the nursing notes.  Pertinent labs & imaging results that were available during my care of the patient were reviewed by me and considered in my medical decision making (see chart for details).  Flu test positive in office.  Will treat with Tamiflu. Continue symptomatic treatment otherwise.  Recommend follow-up if symptoms fail to improve or worsen.  Final Clinical Impressions(s) / UC Diagnoses   Final diagnoses:  Influenza A   Discharge Instructions   None    ED Prescriptions     Medication Sig Dispense Auth. Provider   oseltamivir (TAMIFLU) 75 MG capsule Take 1 capsule (75 mg total) by mouth every 12 (twelve) hours. 10 capsule Francene Finders, PA-C      PDMP not reviewed this encounter.   Francene Finders, PA-C 02/16/21 [phone]

## 2021-02-16 NOTE — ED Triage Notes (Signed)
Pt c/o cough, headache, body aches, malaise, nasal congestion with drainage.   Denies sore throat, chills, nausea, vomiting, diarrhea, constipation.   Onset about 3 weeks ago. Recent flu and covid exposures through family.

## 2021-02-22 ENCOUNTER — Encounter: Payer: 59 | Admitting: Physical Therapy

## 2021-02-24 ENCOUNTER — Other Ambulatory Visit (HOSPITAL_COMMUNITY): Payer: Self-pay

## 2021-03-01 ENCOUNTER — Other Ambulatory Visit: Payer: Self-pay

## 2021-03-01 ENCOUNTER — Ambulatory Visit: Payer: 59

## 2021-03-01 DIAGNOSIS — G8929 Other chronic pain: Secondary | ICD-10-CM

## 2021-03-01 DIAGNOSIS — R293 Abnormal posture: Secondary | ICD-10-CM

## 2021-03-01 DIAGNOSIS — M25512 Pain in left shoulder: Secondary | ICD-10-CM | POA: Diagnosis not present

## 2021-03-01 DIAGNOSIS — M6281 Muscle weakness (generalized): Secondary | ICD-10-CM

## 2021-03-01 DIAGNOSIS — M25612 Stiffness of left shoulder, not elsewhere classified: Secondary | ICD-10-CM | POA: Diagnosis not present

## 2021-03-01 NOTE — Therapy (Signed)
[place], [place], [address] Phone: [phone]   Fax:  [phone]  Physical Therapy Treatment  Patient Details  Name: April Rodriguez MRN: [id number] Date of Birth: 1955/07/01 Referring Provider (PT): Earlie Server, MD   Encounter Date: 03/01/2021   PT End of Session - 03/01/21 0802     Visit Number 4    Number of Visits 9    Date for PT Re-Evaluation 04/01/21    Authorization Type [place] [employer], 25 vists    Authorization Time Period assess FOTO on 3rd visit and re-assess on 8th visit    PT Start Time 0802    PT Stop Time 0844    PT Time Calculation (min) 42 min    Activity Tolerance Patient tolerated treatment well    Behavior During Therapy [facility] for tasks assessed/performed             Past Medical History:  Diagnosis Date   AIN grade III    Allergic rhinitis    Anxiety    Chronic constipation    Depression    Family history of adverse reaction to anesthesia    sister gets nauseated   Frequency of urination    GERD (gastroesophageal reflux disease)    Headache    migraines   History of hiatal hernia    Hypertension    IBS (irritable bowel syndrome)    Rash    UNDER BREAST    Past Surgical History:  Procedure Laterality Date   [place] BUNIONECTOMY RIGHT FOOT  03-07-2000   BREAST BIOPSY     CARDIAC CATHETERIZATION N/A 10/21/2015   Procedure: Left Heart Cath and Coronary Angiography;  Surgeon: Jettie Booze, MD;  Location: [facility];  Service: Cardiovascular;  Laterality: N/A;   CATARACT EXTRACTION W/ INTRAOCULAR LENS  IMPLANT, BILATERAL  2011   COLONOSCOPY  12-15-2009   COLONOSCOPY N/A 04/09/2017   Procedure: COLONOSCOPY;  Surgeon: Ronnette Juniper, MD;  [facility];  Service: Gastroenterology;  Laterality: N/A;   COLONOSCOPY WITH PROPOFOL N/A 03/08/2016   Procedure: COLONOSCOPY WITH PROPOFOL;  Surgeon: Garlan Fair, MD;  [facility];  Service:  Endoscopy;  Laterality: N/A;   CYSTO/  BLADDER BX/  HYDRODISTENTION  01-30-2006   ESOPHAGOGASTRODUODENOSCOPY N/A 04/09/2017   Procedure: ESOPHAGOGASTRODUODENOSCOPY (EGD);  Surgeon: Ronnette Juniper, MD;  Location: [facility];  Service: Gastroenterology;  Laterality: N/A;   HEMORROIDECTOMY  1976   LEFT BREAST BX  1980's   BENIGN   RECTAL EXAM UNDER ANESTHESIA N/A 12/10/2014   Procedure: ANAL EXAM UNDER ANESTHESIA  EXCISIONAL BIOPSY OF PERIANAL NEOPLASM;  Surgeon: [name], MD;  Location: [place];  Service: General;  Laterality: N/A;   TUBAL LIGATION  1980's    There were no vitals filed for this visit.   Subjective Assessment - 03/01/21 0809     Subjective Pt reports using her L shoulder has been bothering more with her using it more over Thanksgiving and with Christmas shopping.    Pertinent History See medical Hx    Diagnostic tests Pt reports x-rays were completed at Dr. Alvester Morin    Currently in Pain? Yes    Pain Score 4     Pain Location Shoulder    Pain Orientation Left    Pain Descriptors / Indicators Aching    Pain Type Chronic pain    Pain Onset More than a month ago    Pain Frequency Intermittent    Aggravating Factors  Sleeping on it, moving it    Pain Relieving Factors HEP               [facility] Adult PT Treatment/Exercise:   Therapeutic Exercise: - UBE, 4', 2" forward and backward - shoulder ext, x15, RTB - shoulder row, x15, RTB - shoulder ER, x15, RTB - serratus punch, x15, RTB - Upper trap stretch c GH jt distraction, x3, 20 sec   Manual Therapy: - R GH jt Inferior distraction and AP mobs, grade lll - STM to the L supraspiatus, upper trap, levator and infraspinatus. TPM was provided to the supra and infraspinatus with decreased muscle tension noted afterwards.  Modalities: Iontophoresis to the L lateral [place] joint using dexamethosone for 54ml at 4 mg/ml. Pt is to wear patch for 6 hours.    Neuromuscular re-ed: - NA   Therapeutic  Activity: - NA   Not completed this session: - Shoulder flexion to 90d, 2x10, 2#                            PT Short Term Goals - 02/15/21 2230       PT SHORT TERM GOAL #1   Title Pt will be Ind in an initial HEP for the L shoulder    Baseline started on eval    Status Achieved    Target Date 02/15/21      PT SHORT TERM GOAL #2   Title Pt will voice measures to assist in decreasing L shoulder pain    Baseline decreased understanding    Status Achieved    Target Date 02/15/21               PT Long Term Goals - 01/25/21 0557       PT LONG TERM GOAL #1   Title Pt will report a decrease in L shoulder pain with daily activities and sleeping to 2/10 or less for improved functional use and more restful sleep    Baseline 4-8/10    Status New    Target Date 04/01/21      PT LONG TERM GOAL #2   Title Increase pt's L shoulder flexion ROM to 125d and abd ROM to 140d for improved L shoulder overhead reaching    Status New    Target Date 04/01/21      PT LONG TERM GOAL #3   Title Pt will demonstrate L shoulder strength of at least 4+/5 for improve L shoulder function    Baseline See flowsheets    Status New    Target Date 04/01/21      PT LONG TERM GOAL #4   Title Increase pt's posterior strength for improved sitting and standing posture with minimized rounded shoulders and thoracic kyphosis    Baseline postural dysfunction    Status New    Target Date 04/01/21      PT LONG TERM GOAL #5   Title Pt ill be ind in a final HEP to mantain achieved LOF    Baseline started on eval    Status New    Target Date 04/01/21                   Plan - 03/01/21 0803     Clinical Impression Statement With increased use of the L UE during the holidays, pt is reporting an increase in L shoulder pain from the initial decrease she experienced. With assessment of L shoulder movement, pt is experiencing  pain at the endrange of flexion and ext. PT was  provided to address GH/scapular posture per [place] and posterior chain and RC strengthening. Pt also received STM to the L supra and infrapinatus, upper trap and levator muscles f/b TPM to the surpa and infrspinatus. Decreased muscle tension was noted afterwards. Pt then received inotophoresis to the L shoulder. Pt will continue to benefit from skilled PT to decrease pain and impove functional use of the L shoulder/UE.    Personal Factors and Comorbidities Time since onset of injury/illness/exacerbation;Comorbidity 1    Comorbidities see medical Hx    Examination-Activity Limitations Reach Overhead    Examination-Participation Restrictions Yard Work    Stability/Clinical Decision Making Stable/Uncomplicated    Clinical Decision Making Low    Rehab Potential Good    PT Frequency 1x / week    PT Duration 8 weeks    PT Treatment/Interventions ADLs/Self Care Home Management;Cryotherapy;Electrical Stimulation;Iontophoresis 4mg /ml Dexamethasone;Moist Heat;Neuromuscular re-education;Therapeutic exercise;Patient/family education;Manual techniques;Joint Manipulations;Dry needling;Taping;Vasopneumatic Device    PT Next Visit Plan Progress ther ex and modalitiy use as indicated    PT Home Exercise Plan [email or web address]             Patient will benefit from skilled therapeutic intervention in order to improve the following deficits and impairments:  Decreased range of motion, Impaired UE functional use, Decreased activity tolerance, Pain, Postural dysfunction  Visit Diagnosis: Chronic left shoulder pain  Abnormal posture  Muscle weakness (generalized)  Decreased ROM of left shoulder     Problem List Patient Active Problem List   Diagnosis Date Noted   Precordial pain    Persistent disorder of initiating or maintaining sleep 06/28/2011   Gar Ponto MS, PT 03/02/21 8:48 AM   [facility] [address] [place], [place], [address] Phone:  [phone]   Fax:  [phone]  Name: April Rodriguez MRN: [id number] Date of Birth: 31-Dec-1955

## 2021-03-02 ENCOUNTER — Other Ambulatory Visit (HOSPITAL_COMMUNITY): Payer: Self-pay

## 2021-03-02 DIAGNOSIS — Z1231 Encounter for screening mammogram for malignant neoplasm of breast: Secondary | ICD-10-CM | POA: Diagnosis not present

## 2021-03-02 DIAGNOSIS — G43909 Migraine, unspecified, not intractable, without status migrainosus: Secondary | ICD-10-CM | POA: Diagnosis not present

## 2021-03-02 DIAGNOSIS — R300 Dysuria: Secondary | ICD-10-CM | POA: Diagnosis not present

## 2021-03-02 DIAGNOSIS — Z1211 Encounter for screening for malignant neoplasm of colon: Secondary | ICD-10-CM | POA: Diagnosis not present

## 2021-03-02 DIAGNOSIS — F419 Anxiety disorder, unspecified: Secondary | ICD-10-CM | POA: Diagnosis not present

## 2021-03-02 DIAGNOSIS — E7800 Pure hypercholesterolemia, unspecified: Secondary | ICD-10-CM | POA: Diagnosis not present

## 2021-03-02 DIAGNOSIS — F3341 Major depressive disorder, recurrent, in partial remission: Secondary | ICD-10-CM | POA: Diagnosis not present

## 2021-03-02 DIAGNOSIS — I10 Essential (primary) hypertension: Secondary | ICD-10-CM | POA: Diagnosis not present

## 2021-03-02 DIAGNOSIS — Z0000 Encounter for general adult medical examination without abnormal findings: Secondary | ICD-10-CM | POA: Diagnosis not present

## 2021-03-02 MED ORDER — HYDROCHLOROTHIAZIDE 12.5 MG PO CAPS
12.5000 mg | ORAL_CAPSULE | Freq: Every day | ORAL | 4 refills | Status: DC
  Filled 2021-03-02: qty 90, 90d supply, fill #0

## 2021-03-02 MED ORDER — METOPROLOL SUCCINATE ER 25 MG PO TB24
12.5000 mg | ORAL_TABLET | Freq: Every day | ORAL | 4 refills | Status: DC
  Filled 2021-03-02 – 2022-01-01 (×2): qty 45, 90d supply, fill #0

## 2021-03-02 MED ORDER — ROSUVASTATIN CALCIUM 20 MG PO TABS
20.0000 mg | ORAL_TABLET | Freq: Every day | ORAL | 4 refills | Status: DC
  Filled 2021-03-02: qty 90, 90d supply, fill #0

## 2021-03-02 MED ORDER — BUSPIRONE HCL 5 MG PO TABS: 5.0000 mg | ORAL_TABLET | Freq: Two times a day (BID) | ORAL | 5 refills | Status: DC

## 2021-03-02 MED ORDER — TOPIRAMATE 25 MG PO TABS
25.0000 mg | ORAL_TABLET | Freq: Every day | ORAL | 4 refills | Status: DC
  Filled 2021-03-02 – 2021-03-19 (×2): qty 90, 90d supply, fill #0
  Filled 2021-06-24: qty 90, 90d supply, fill #1
  Filled 2021-09-21: qty 90, 90d supply, fill #2
  Filled 2021-12-28: qty 90, 90d supply, fill #3

## 2021-03-02 MED ORDER — BUSPIRONE HCL 5 MG PO TABS
5.0000 mg | ORAL_TABLET | Freq: Two times a day (BID) | ORAL | 5 refills | Status: DC
  Filled 2021-03-02: qty 60, 30d supply, fill #0
  Filled 2021-08-08: qty 60, 30d supply, fill #1
  Filled 2021-12-04: qty 60, 30d supply, fill #2

## 2021-03-02 MED ORDER — VENLAFAXINE HCL 100 MG PO TABS
100.0000 mg | ORAL_TABLET | Freq: Every day | ORAL | 1 refills | Status: DC
  Filled 2021-03-02: qty 90, 90d supply, fill #0
  Filled 2021-05-29: qty 90, 90d supply, fill #1

## 2021-03-02 MED ORDER — LOSARTAN POTASSIUM 50 MG PO TABS
50.0000 mg | ORAL_TABLET | Freq: Every day | ORAL | 4 refills | Status: DC
  Filled 2021-03-02: qty 90, 90d supply, fill #0

## 2021-03-03 ENCOUNTER — Other Ambulatory Visit (HOSPITAL_COMMUNITY): Payer: Self-pay

## 2021-03-06 ENCOUNTER — Other Ambulatory Visit (HOSPITAL_COMMUNITY): Payer: Self-pay

## 2021-03-08 ENCOUNTER — Other Ambulatory Visit: Payer: Self-pay

## 2021-03-08 ENCOUNTER — Other Ambulatory Visit (HOSPITAL_COMMUNITY): Payer: Self-pay

## 2021-03-08 ENCOUNTER — Ambulatory Visit: Payer: 59 | Attending: Orthopedic Surgery | Admitting: Physical Therapy

## 2021-03-08 DIAGNOSIS — M25612 Stiffness of left shoulder, not elsewhere classified: Secondary | ICD-10-CM | POA: Diagnosis not present

## 2021-03-08 DIAGNOSIS — M6281 Muscle weakness (generalized): Secondary | ICD-10-CM | POA: Diagnosis not present

## 2021-03-08 DIAGNOSIS — R293 Abnormal posture: Secondary | ICD-10-CM | POA: Insufficient documentation

## 2021-03-08 DIAGNOSIS — G8929 Other chronic pain: Secondary | ICD-10-CM | POA: Diagnosis not present

## 2021-03-08 DIAGNOSIS — M25512 Pain in left shoulder: Secondary | ICD-10-CM | POA: Diagnosis not present

## 2021-03-08 NOTE — Therapy (Addendum)
[place], [place], [address] Phone: [phone]   Fax:  [phone]  Physical Therapy Treatment  Patient Details  Name: April Rodriguez MRN: [id number] Date of Birth: [birth year] Referring Provider (PT): Earlie Server, MD   Encounter Date: 03/08/2021   PT End of Session - 03/08/21 0832     Visit Number 5    Number of Visits 9    Date for PT Re-Evaluation 04/01/21    Authorization Type [place] [employer], 25 vists    PT Start Time 0808    PT Stop Time 0846    PT Time Calculation (min) 38 min    Activity Tolerance Patient tolerated treatment well    Behavior During Therapy [facility] for tasks assessed/performed             Past Medical History:  Diagnosis Date   AIN grade III    Allergic rhinitis    Anxiety    Chronic constipation    Depression    Family history of adverse reaction to anesthesia    sister gets nauseated   Frequency of urination    GERD (gastroesophageal reflux disease)    Headache    migraines   History of hiatal hernia    Hypertension    IBS (irritable bowel syndrome)    Rash    UNDER BREAST    Past Surgical History:  Procedure Laterality Date   [place] BUNIONECTOMY RIGHT FOOT  03-07-2000   BREAST BIOPSY     CARDIAC CATHETERIZATION N/A 10/21/2015   Procedure: Left Heart Cath and Coronary Angiography;  Surgeon: Jettie Booze, MD;  Location: [facility];  Service: Cardiovascular;  Laterality: N/A;   CATARACT EXTRACTION W/ INTRAOCULAR LENS  IMPLANT, BILATERAL  2011   COLONOSCOPY  12-15-2009   COLONOSCOPY N/A 04/09/2017   Procedure: COLONOSCOPY;  Surgeon: Ronnette Juniper, MD;  [facility];  Service: Gastroenterology;  Laterality: N/A;   COLONOSCOPY WITH PROPOFOL N/A 03/08/2016   Procedure: COLONOSCOPY WITH PROPOFOL;  Surgeon: Garlan Fair, MD;  [facility];  Service: Endoscopy;  Laterality: N/A;   CYSTO/  BLADDER BX/  HYDRODISTENTION  01-30-2006    ESOPHAGOGASTRODUODENOSCOPY N/A 04/09/2017   Procedure: ESOPHAGOGASTRODUODENOSCOPY (EGD);  Surgeon: Ronnette Juniper, MD;  Location: [facility];  Service: Gastroenterology;  Laterality: N/A;   HEMORROIDECTOMY  1976   LEFT BREAST BX  1980's   BENIGN   RECTAL EXAM UNDER ANESTHESIA N/A 12/10/2014   Procedure: ANAL EXAM UNDER ANESTHESIA  EXCISIONAL BIOPSY OF PERIANAL NEOPLASM;  Surgeon: [name], MD;  Location: [facility];  Service: General;  Laterality: N/A;   TUBAL LIGATION  1980's    There were no vitals filed for this visit.   Subjective Assessment - 03/08/21 0811     Subjective Patient with Lt sided achiness today, minimal today. The more I use it the more it hurts.  Has been doing most of her exercises.    Currently in Pain? Yes    Pain Score 2     Pain Location Shoulder    Pain Orientation Left;Mid;Proximal    Pain Descriptors / Indicators Aching    Pain Type Chronic pain    Pain Onset More than a month ago    Pain Frequency Intermittent    Aggravating Factors  sleepin on it , moving it    Pain Relieving Factors HEP    Multiple Pain Sites No  [place] Adult PT Treatment/Exercise:   Therapeutic Exercise: UBE 4 min, 2 min in each direction  Corner stretch 30 sec x 3 < 90 deg  Wall slides for shoulder flexion AAROM  Standing shoulder theraband  Green row x 15 Green extension x 15 ER x 15 IR x 15  Seated and supine horizontal pull green too heavy mod  to red ban for improved form x 15 x 2 sets      Manual Therapy:  PROM all planes , brief with self care  Neuromuscular re-ed: N/A   Therapeutic Activity: N/A   Modalities: N/A   Self Care: Progress, ROM comparison, MRI benefits and limitations , course of care, POC  HEP changes    Consider / progression for next session:                 PT Short Term Goals - 02/15/21 2230       PT SHORT TERM GOAL #1   Title Pt will be Ind in an initial HEP for the L shoulder     Baseline started on eval    Status Achieved    Target Date 02/15/21      PT SHORT TERM GOAL #2   Title Pt will voice measures to assist in decreasing L shoulder pain    Baseline decreased understanding    Status Achieved    Target Date 02/15/21               PT Long Term Goals - 03/08/21 0832       PT LONG TERM GOAL #1   Title Pt will report a decrease in L shoulder pain with daily activities and sleeping to 2/10 or less for improved functional use and more restful sleep    Baseline wakes up most nights    Status On-going      PT LONG TERM GOAL #2   Title Increase pt's L shoulder flexion ROM to 125d and abd ROM to 140d for improved L shoulder overhead reaching    Baseline flexion 140, abd 125 deg    Status Partially Met      PT LONG TERM GOAL #3   Title Pt will demonstrate L shoulder strength of at least 4+/5 for improve L shoulder function    Status On-going      PT LONG TERM GOAL #4   Title Increase pt's posterior strength for improved sitting and standing posture with minimized rounded shoulders and thoracic kyphosis    Status On-going      PT LONG TERM GOAL #5   Title Pt ill be ind in a final HEP to mantain achieved LOF    Status On-going                   Plan - 03/08/21 1224     Clinical Impression Statement Patient continues with mild but consistent L shoulder pain interfering with sleep and use of LUE for ADLs.  She has improved in her ability to reach overhead. Shoulder is stiff in all planes of motion with firm end feel.  She needed heavy cueing today for HEP technique.  She may consider MRI, further therapy will be decided next visit.    PT Treatment/Interventions ADLs/Self Care Home Management;Cryotherapy;Electrical Stimulation;Iontophoresis 72m/ml Dexamethasone;Moist Heat;Neuromuscular re-education;Therapeutic exercise;Patient/family education;Manual techniques;Joint Manipulations;Dry needling;Taping;Vasopneumatic Device    PT Next Visit Plan Cont  PT? Postural strength and shoulder mobility in general , manual    PT Home Exercise Plan [email or web address]    Consulted  and Agree with Plan of Care Patient             Patient will benefit from skilled therapeutic intervention in order to improve the following deficits and impairments:  Decreased range of motion, Impaired UE functional use, Decreased activity tolerance, Pain, Postural dysfunction  Visit Diagnosis: Chronic left shoulder pain  Abnormal posture  Muscle weakness (generalized)  Decreased ROM of left shoulder     Problem List Patient Active Problem List   Diagnosis Date Noted   Precordial pain    Persistent disorder of initiating or maintaining sleep 06/28/2011    Symphony Demuro, PT 03/08/2021, 10:09 AM  [facility] [address] [place], [place], [address] Phone: [phone]   Fax:  [phone]  Name: April Rodriguez MRN: [id number] Date of Birth: 02-14-1956   Raeford Razor, PT 03/08/21 10:09 AM Phone: [phone] Fax: [phone]

## 2021-03-08 NOTE — Patient Instructions (Signed)
Access Code: [email or web address] URL: [email or web address] Date: 03/08/2021 Prepared by: Raeford Razor  Exercises Shoulder extension with resistance - Neutral - 2 x daily - 7 x weekly - 1 sets - 10 reps - 3 hold Standing Shoulder Row with Anchored Resistance - 2 x daily - 7 x weekly - 1 sets - 10 reps - 3 hold Shoulder External Rotation and Scapular Retraction with Resistance - 2 x daily - 7 x weekly - 1 sets - 10 reps - 3 hold Seated Upper Trapezius Stretch (Mirrored) - 2 x daily - 7 x weekly - 1 sets - 3 reps - 20 hold Corner [place] Major Stretch - 1-2 x daily - 7 x weekly - 1 sets - 5 reps - 30 hold Standing shoulder flexion wall slides - 1-2 x daily - 7 x weekly - 1 sets - 5 reps - 30 hold Supine Shoulder Horizontal Abduction with Resistance - 1-2 x daily - 7 x weekly - 2 sets - 10 reps - 5 hold

## 2021-03-15 ENCOUNTER — Ambulatory Visit: Payer: 59

## 2021-03-20 ENCOUNTER — Other Ambulatory Visit (HOSPITAL_COMMUNITY): Payer: Self-pay

## 2021-03-21 ENCOUNTER — Encounter: Payer: Self-pay | Admitting: Physical Therapy

## 2021-03-21 ENCOUNTER — Other Ambulatory Visit: Payer: Self-pay

## 2021-03-21 ENCOUNTER — Ambulatory Visit: Payer: 59 | Admitting: Physical Therapy

## 2021-03-21 DIAGNOSIS — M6281 Muscle weakness (generalized): Secondary | ICD-10-CM

## 2021-03-21 DIAGNOSIS — M25512 Pain in left shoulder: Secondary | ICD-10-CM

## 2021-03-21 DIAGNOSIS — M25612 Stiffness of left shoulder, not elsewhere classified: Secondary | ICD-10-CM

## 2021-03-21 DIAGNOSIS — G8929 Other chronic pain: Secondary | ICD-10-CM | POA: Diagnosis not present

## 2021-03-21 DIAGNOSIS — R293 Abnormal posture: Secondary | ICD-10-CM

## 2021-03-21 NOTE — Therapy (Signed)
[place], [place], [address] Phone: [phone]   Fax:  [phone]  Physical Therapy Treatment/Renewal  Patient Details  Name: April Rodriguez MRN: [id number] Date of Birth: 1955-12-17 Referring Provider (PT): Earlie Server, MD   Encounter Date: 03/21/2021   PT End of Session - 03/21/21 0849     Visit Number 6    Number of Visits 12    Date for PT Re-Evaluation 05/02/21    Authorization Type [place] [employer], 25 vists    Authorization Time Period assess FOTO on 3rd visit and re-assess on 8th visit    PT Start Time 0845    PT Stop Time 0930    PT Time Calculation (min) 45 min    Activity Tolerance Patient tolerated treatment well    Behavior During Therapy [facility] for tasks assessed/performed             Past Medical History:  Diagnosis Date   AIN grade III    Allergic rhinitis    Anxiety    Chronic constipation    Depression    Family history of adverse reaction to anesthesia    sister gets nauseated   Frequency of urination    GERD (gastroesophageal reflux disease)    Headache    migraines   History of hiatal hernia    Hypertension    IBS (irritable bowel syndrome)    Rash    UNDER BREAST    Past Surgical History:  Procedure Laterality Date   [place] BUNIONECTOMY RIGHT FOOT  03-07-2000   BREAST BIOPSY     CARDIAC CATHETERIZATION N/A 10/21/2015   Procedure: Left Heart Cath and Coronary Angiography;  Surgeon: Jettie Booze, MD;  Location: [facility];  Service: Cardiovascular;  Laterality: N/A;   CATARACT EXTRACTION W/ INTRAOCULAR LENS  IMPLANT, BILATERAL  2011   COLONOSCOPY  12-15-2009   COLONOSCOPY N/A 04/09/2017   Procedure: COLONOSCOPY;  Surgeon: Ronnette Juniper, MD;  [facility];  Service: Gastroenterology;  Laterality: N/A;   COLONOSCOPY WITH PROPOFOL N/A 03/08/2016   Procedure: COLONOSCOPY WITH PROPOFOL;  Surgeon: Garlan Fair, MD;  [facility];   Service: Endoscopy;  Laterality: N/A;   CYSTO/  BLADDER BX/  HYDRODISTENTION  01-30-2006   ESOPHAGOGASTRODUODENOSCOPY N/A 04/09/2017   Procedure: ESOPHAGOGASTRODUODENOSCOPY (EGD);  Surgeon: Ronnette Juniper, MD;  Location: [facility];  Service: Gastroenterology;  Laterality: N/A;   HEMORROIDECTOMY  1976   LEFT BREAST BX  1980's   BENIGN   RECTAL EXAM UNDER ANESTHESIA N/A 12/10/2014   Procedure: ANAL EXAM UNDER ANESTHESIA  EXCISIONAL BIOPSY OF PERIANAL NEOPLASM;  Surgeon: [name], MD;  Location: [facility];  Service: General;  Laterality: N/A;   TUBAL LIGATION  1980's    There were no vitals filed for this visit.   Subjective Assessment - 03/21/21 0848     Subjective Its bothering me a little bit today, 2/10.  It doesnt get unbearable.    Currently in Pain? Yes    Pain Score 2              [facility] Adult PT Treatment/Exercise:   Therapeutic Exercise: UBE 5 min L1 for warm up Pulleys flexion 2 min, standing horizontal abd and scaption, cues for posture Shoulder external rotation GTB x 15 Horiztonal pull GTB x 10 , less cues needed today  Sidelying shoulder external rotation 3 bs x 15    Bent elbow abduction 3 lbs x 15  Flexion 3 lbs x 15   Standing bilateral row GTB x 2 x 15 , sl abd  Wall for scapular work Wall push up x 15 Lateral band walks (red/yellow) Overhead lift with Yelow T band       PT Short Term Goals - 03/21/21 0904       PT SHORT TERM GOAL #1   Title Pt will be Ind in an initial HEP for the L shoulder    Status Achieved      PT SHORT TERM GOAL #2   Title Pt will voice measures to assist in decreasing L shoulder pain    Status Achieved               PT Long Term Goals - 03/21/21 0905       PT LONG TERM GOAL #1   Title Pt will report a decrease in L shoulder pain with daily activities and sleeping to 2/10 or less for improved functional use and more restful sleep    Status Achieved      PT LONG TERM GOAL #2   Title  Increase pt's L shoulder flexion ROM to 125d and abd ROM to 140d for improved L shoulder overhead reaching    Baseline flexion 140, abd 125 deg    Status Partially Met      PT LONG TERM GOAL #3   Title Pt will demonstrate L shoulder strength of at least 4+/5 for improve L shoulder function    Status On-going      PT LONG TERM GOAL #4   Title Increase pt's posterior strength for improved sitting and standing posture with minimized rounded shoulders and thoracic kyphosis    Baseline postural dysfunction    Status On-going      PT LONG TERM GOAL #5   Title Pt ill be ind in a final HEP to mantain achieved LOF    Status On-going                   Plan - 03/21/21 0855     Clinical Impression Statement Patient cont to have stiffness in L UE limiting full function with reaching and lifting, carryingitems, sleeping.  She needed less cueing than last vist but still lacks postural endurance in periscapular mm.  She has partially met her goals.  She will benefit from more visits to address functional limitations.    Personal Factors and Comorbidities Time since onset of injury/illness/exacerbation;Comorbidity 1    Comorbidities see medical Hx    Examination-Activity Limitations Reach Overhead;Lift    Examination-Participation Restrictions Yard Work    Stability/Clinical Decision Making Stable/Uncomplicated    Clinical Decision Making Low    Rehab Potential Good    PT Frequency 1x / week    PT Duration 6 weeks    PT Treatment/Interventions ADLs/Self Care Home Management;Cryotherapy;Electrical Stimulation;Iontophoresis 39m/ml Dexamethasone;Moist Heat;Neuromuscular re-education;Therapeutic exercise;Patient/family education;Manual techniques;Joint Manipulations;Dry needling;Taping;Vasopneumatic Device    PT Next Visit Plan Cont PT? Postural strength and shoulder mobility in general , manual    PT Home Exercise Plan [email or web address]    Consulted and Agree with Plan of Care Patient              Patient will benefit from skilled therapeutic intervention in order to improve the following deficits and impairments:  Decreased range of motion, Impaired UE functional use, Decreased activity tolerance, Pain, Postural dysfunction  Visit Diagnosis: Chronic left shoulder pain  Abnormal posture  Muscle weakness (generalized)  Decreased ROM of left shoulder  Problem List Patient Active Problem List   Diagnosis Date Noted   Precordial pain    Persistent disorder of initiating or maintaining sleep 06/28/2011    Chico Cawood, PT 03/21/2021, 9:29 AM  [facility] [address] [place], [place], [address] Phone: [phone]   Fax:  [phone]  Name: April Rodriguez MRN: [id number] Date of Birth: 07-04-1955   Raeford Razor, PT 03/21/21 9:29 AM Phone: [phone] Fax: [phone]

## 2021-03-22 ENCOUNTER — Other Ambulatory Visit (HOSPITAL_COMMUNITY): Payer: Self-pay | Admitting: Orthopedic Surgery

## 2021-03-22 DIAGNOSIS — M25512 Pain in left shoulder: Secondary | ICD-10-CM

## 2021-03-30 ENCOUNTER — Ambulatory Visit: Payer: 59

## 2021-03-30 ENCOUNTER — Other Ambulatory Visit: Payer: Self-pay

## 2021-03-30 DIAGNOSIS — R293 Abnormal posture: Secondary | ICD-10-CM

## 2021-03-30 DIAGNOSIS — G8929 Other chronic pain: Secondary | ICD-10-CM | POA: Diagnosis not present

## 2021-03-30 DIAGNOSIS — M25612 Stiffness of left shoulder, not elsewhere classified: Secondary | ICD-10-CM

## 2021-03-30 DIAGNOSIS — M6281 Muscle weakness (generalized): Secondary | ICD-10-CM

## 2021-03-30 DIAGNOSIS — M25512 Pain in left shoulder: Secondary | ICD-10-CM | POA: Diagnosis not present

## 2021-03-30 NOTE — Therapy (Addendum)
[place], [place], [address] Phone: [phone]   Fax:  [phone]  Physical Therapy Treatment/Discharge  Patient Details  Name: April Rodriguez MRN: [id number] Date of Birth: [birth year] Referring Provider (PT): Earlie Server, MD   Encounter Date: 03/30/2021   PT End of Session - 03/30/21 0816     Visit Number 7    Number of Visits 12    Date for PT Re-Evaluation 05/02/21    Authorization Type [employer], 25 vists    Authorization Time Period assess FOTO on 3rd visit and re-assess on 8th visit    PT Start Time 0800    PT Stop Time 0840    PT Time Calculation (min) 40 min    Activity Tolerance Patient tolerated treatment well    Behavior During Therapy WFL for tasks assessed/performed             Past Medical History:  Diagnosis Date   AIN grade III    Allergic rhinitis    Anxiety    Chronic constipation    Depression    Family history of adverse reaction to anesthesia    sister gets nauseated   Frequency of urination    GERD (gastroesophageal reflux disease)    Headache    migraines   History of hiatal hernia    Hypertension    IBS (irritable bowel syndrome)    Rash    UNDER BREAST    Past Surgical History:  Procedure Laterality Date   A[place]IN BUNIONECTOMY RIGHT FOOT  03-07-2000   BREAST BIOPSY     CARDIAC CATHETERIZATION N/A 10/21/2015   Procedure: Left Heart Cath and Coronary Angiography;  Surgeon: Jettie Booze, MD;  Location: [facility];  Service: Cardiovascular;  Laterality: N/A;   CATARACT EXTRACTION W/ INTRAOCULAR LENS  IMPLANT, BILATERAL  2011   COLONOSCOPY  12-15-2009   COLONOSCOPY N/A 04/09/2017   Procedure: COLONOSCOPY;  Surgeon: Ronnette Juniper, MD;  Location: [facility];  Service: Gastroenterology;  Laterality: N/A;   COLONOSCOPY WITH PROPOFOL N/A 03/08/2016   Procedure: COLONOSCOPY WITH PROPOFOL;  Surgeon: Garlan Fair, MD;  Location: [facility];  Service: Endoscopy;  Laterality: N/A;   CYSTO/  BLADDER BX/  HYDRODISTENTION  01-30-2006   ESOPHAGOGASTRODUODENOSCOPY N/A 04/09/2017   Procedure: ESOPHAGOGASTRODUODENOSCOPY (EGD);  Surgeon: Ronnette Juniper, MD;  Location: [facility];  Service: Gastroenterology;  Laterality: N/A;   HEMORROIDECTOMY  1976   LEFT BREAST BX  1980's   BENIGN   RECTAL EXAM UNDER ANESTHESIA N/A 12/10/2014   Procedure: ANAL EXAM UNDER ANESTHESIA  EXCISIONAL BIOPSY OF PERIANAL NEOPLASM;  Surgeon: [name], MD;  Location: [facility];  Service: General;  Laterality: N/A;   TUBAL LIGATION  1980's    There were no vitals filed for this visit.   Subjective Assessment - 03/30/21 0809     Subjective My L shoulder has been feeling better this week    Diagnostic tests Pt reports x-rays were completed at Dr. Alvester Morin    Currently in Pain? Yes    Pain Score 1     Pain Location Shoulder    Pain Orientation Left;Mid;Proximal    Pain Descriptors / Indicators Aching    Pain Type Chronic pain    Pain Radiating Towards lateral upper arm    Pain Onset More than a month ago    Pain Frequency Intermittent    Aggravating Factors  Sleeping on my L side    Pain Relieving  Factors HEP                [facility] PT Assessment - 03/30/21 0001       AROM   Left Shoulder Flexion 135 Degrees    Left Shoulder ABduction 126 Degrees                                  [facility] Adult PT Treatment/Exercise:   Therapeutic Exercise: UBE 4 min L1 for warm up, forward and backwards Pulleys flexion 2 min, standing horizontal abd and scaption, cues for posture Shoulder external rotation GTB x 15 Horiztonal pull GTB x 15 Side steps L UE ER position, RTB, x5  Sidelying shoulder external rotation 3 bs x 15                          Bent elbow abduction 3 lbs x 15                          Flexion 3 lbs x 15  Standing bilateral row GTB x 2 x 15 , sl abd  Wall for scapular work Wall push up x  15 Lateral band walks (red/yellow) x10 Overhead lift with Yelow T band x10      PT Short Term Goals - 03/21/21 0904       PT SHORT TERM GOAL #1   Title Pt will be Ind in an initial HEP for the L shoulder    Status Achieved      PT SHORT TERM GOAL #2   Title Pt will voice measures to assist in decreasing L shoulder pain    Status Achieved               PT Long Term Goals - 03/21/21 0905       PT LONG TERM GOAL #1   Title Pt will report a decrease in L shoulder pain with daily activities and sleeping to 2/10 or less for improved functional use and more restful sleep    Status Achieved      PT LONG TERM GOAL #2   Title Increase pt's L shoulder flexion ROM to 125d and abd ROM to 140d for improved L shoulder overhead reaching    Baseline flexion 140, abd 125 deg    Status Partially Met      PT LONG TERM GOAL #3   Title Pt will demonstrate L shoulder strength of at least 4+/5 for improve L shoulder function    Status On-going      PT LONG TERM GOAL #4   Title Increase pt's posterior strength for improved sitting and standing posture with minimized rounded shoulders and thoracic kyphosis    Baseline postural dysfunction    Status On-going      PT LONG TERM GOAL #5   Title Pt ill be ind in a final HEP to mantain achieved LOF    Status On-going                   Plan - 03/30/21 0846     Clinical Impression Statement PT was provided for L shoulder strengthening for the RTC and periscapular muscles. L shoulder pain overall is improving with UE use both below and above her shoulder height. Pt tolerated today's PT session without adverse effects.    Personal Factors and Comorbidities Time since onset of injury/illness/exacerbation;Comorbidity  1    Comorbidities see medical Hx    Examination-Activity Limitations Reach Overhead;Lift    Examination-Participation Restrictions Yard Work    Stability/Clinical Decision Making Stable/Uncomplicated    Clinical Decision  Making Low    Rehab Potential Good    PT Frequency 1x / week    PT Duration 6 weeks    PT Treatment/Interventions ADLs/Self Care Home Management;Cryotherapy;Electrical Stimulation;Iontophoresis 52m/ml Dexamethasone;Moist Heat;Neuromuscular re-education;Therapeutic exercise;Patient/family education;Manual techniques;Joint Manipulations;Dry needling;Taping;Vasopneumatic Device    PT Next Visit Plan Cont PT? Postural strength and shoulder mobility in general , manual, Progress strengthening as indicated.    PT Home Exercise Plan [id number]    Consulted and Agree with Plan of Care Patient             Patient will benefit from skilled therapeutic intervention in order to improve the following deficits and impairments:  Decreased range of motion, Impaired UE functional use, Decreased activity tolerance, Pain, Postural dysfunction  Visit Diagnosis: Chronic left shoulder pain  Abnormal posture  Muscle weakness (generalized)  Decreased ROM of left shoulder     Problem List Patient Active Problem List   Diagnosis Date Noted   Precordial pain    Persistent disorder of initiating or maintaining sleep 06/28/2011   AGar PontoMS, PT 03/30/21 1:22 PM  PHYSICAL THERAPY DISCHARGE SUMMARY  Visits from Start of Care: 7  Current functional level related to goals / functional outcomes: See above   Remaining deficits: See above   Education / Equipment: HEP   Patient agrees to discharge. Patient goals were partially met. Patient is being discharged due to not returning since the last visit. Pt cancelled remaining visits stating PT was no longer needed after MRI.    [place] [address]Phone: [phone]  Fax:  [phone] Name: CESTY AHUJAMRN: [id number] of Birth: [birth year]

## 2021-03-31 ENCOUNTER — Ambulatory Visit (HOSPITAL_COMMUNITY)
Admission: RE | Admit: 2021-03-31 | Discharge: 2021-03-31 | Disposition: A | Discharge status: Home / Self Care | Payer: 59 | Source: Ambulatory Visit | Attending: Orthopedic Surgery | Admitting: Orthopedic Surgery

## 2021-03-31 DIAGNOSIS — M19012 Primary osteoarthritis, left shoulder: Secondary | ICD-10-CM | POA: Diagnosis not present

## 2021-03-31 DIAGNOSIS — M25512 Pain in left shoulder: Secondary | ICD-10-CM | POA: Insufficient documentation

## 2021-03-31 DIAGNOSIS — M7552 Bursitis of left shoulder: Secondary | ICD-10-CM | POA: Diagnosis not present

## 2021-03-31 DIAGNOSIS — M25412 Effusion, left shoulder: Secondary | ICD-10-CM | POA: Diagnosis not present

## 2021-04-03 ENCOUNTER — Other Ambulatory Visit (HOSPITAL_COMMUNITY): Payer: Self-pay

## 2021-04-05 ENCOUNTER — Other Ambulatory Visit (HOSPITAL_COMMUNITY): Payer: Self-pay

## 2021-04-07 ENCOUNTER — Ambulatory Visit: Payer: 59 | Admitting: Physical Therapy

## 2021-04-12 ENCOUNTER — Ambulatory Visit: Payer: 59 | Admitting: Physical Therapy

## 2021-04-19 ENCOUNTER — Ambulatory Visit: Payer: 59

## 2021-05-08 ENCOUNTER — Other Ambulatory Visit (HOSPITAL_COMMUNITY): Payer: Self-pay

## 2021-05-10 ENCOUNTER — Other Ambulatory Visit (HOSPITAL_COMMUNITY): Payer: Self-pay

## 2021-05-19 ENCOUNTER — Other Ambulatory Visit (HOSPITAL_COMMUNITY): Payer: Self-pay

## 2021-05-29 ENCOUNTER — Other Ambulatory Visit (HOSPITAL_COMMUNITY): Payer: Self-pay

## 2021-05-30 ENCOUNTER — Other Ambulatory Visit (HOSPITAL_COMMUNITY): Payer: Self-pay

## 2021-06-02 ENCOUNTER — Other Ambulatory Visit (HOSPITAL_COMMUNITY): Payer: Self-pay

## 2021-06-05 ENCOUNTER — Other Ambulatory Visit (HOSPITAL_COMMUNITY): Payer: Self-pay

## 2021-06-06 ENCOUNTER — Other Ambulatory Visit (HOSPITAL_COMMUNITY): Payer: Self-pay

## 2021-06-08 ENCOUNTER — Other Ambulatory Visit (HOSPITAL_COMMUNITY): Payer: Self-pay

## 2021-06-09 ENCOUNTER — Other Ambulatory Visit (HOSPITAL_COMMUNITY): Payer: Self-pay

## 2021-06-14 ENCOUNTER — Other Ambulatory Visit (HOSPITAL_COMMUNITY): Payer: Self-pay

## 2021-06-14 DIAGNOSIS — Z961 Presence of intraocular lens: Secondary | ICD-10-CM | POA: Diagnosis not present

## 2021-06-14 DIAGNOSIS — H5213 Myopia, bilateral: Secondary | ICD-10-CM | POA: Diagnosis not present

## 2021-06-14 MED ORDER — AMOXICILLIN 500 MG PO CAPS
500.0000 mg | ORAL_CAPSULE | Freq: Three times a day (TID) | ORAL | 0 refills | Status: DC
  Filled 2021-06-14: qty 15, 5d supply, fill #0

## 2021-06-19 ENCOUNTER — Other Ambulatory Visit (HOSPITAL_COMMUNITY): Payer: Self-pay

## 2021-06-21 ENCOUNTER — Other Ambulatory Visit (HOSPITAL_COMMUNITY): Payer: Self-pay

## 2021-06-26 ENCOUNTER — Other Ambulatory Visit (HOSPITAL_COMMUNITY): Payer: Self-pay

## 2021-06-28 ENCOUNTER — Other Ambulatory Visit (HOSPITAL_COMMUNITY): Payer: Self-pay

## 2021-06-29 ENCOUNTER — Other Ambulatory Visit: Payer: Self-pay | Admitting: *Deleted

## 2021-06-29 ENCOUNTER — Telehealth: Payer: Self-pay | Admitting: *Deleted

## 2021-06-29 NOTE — Telephone Encounter (Signed)
S/w pt clarified dose of rosuvastatin one (1) tablet by mouth (10 mg ) daily. Updated medication list. Sent secure chat back to phillip at the [facility] and Dr. Clayton Bibles who agreed pt can receive # 100 tablets.  ?

## 2021-06-30 ENCOUNTER — Other Ambulatory Visit (HOSPITAL_COMMUNITY): Payer: Self-pay

## 2021-08-01 ENCOUNTER — Other Ambulatory Visit (HOSPITAL_COMMUNITY): Payer: Self-pay

## 2021-08-01 DIAGNOSIS — G43909 Migraine, unspecified, not intractable, without status migrainosus: Secondary | ICD-10-CM | POA: Diagnosis not present

## 2021-08-01 DIAGNOSIS — K219 Gastro-esophageal reflux disease without esophagitis: Secondary | ICD-10-CM | POA: Diagnosis not present

## 2021-08-01 DIAGNOSIS — K589 Irritable bowel syndrome without diarrhea: Secondary | ICD-10-CM | POA: Diagnosis not present

## 2021-08-01 DIAGNOSIS — G4700 Insomnia, unspecified: Secondary | ICD-10-CM | POA: Diagnosis not present

## 2021-08-01 DIAGNOSIS — Z6822 Body mass index (BMI) 22.0-22.9, adult: Secondary | ICD-10-CM | POA: Diagnosis not present

## 2021-08-01 DIAGNOSIS — I10 Essential (primary) hypertension: Secondary | ICD-10-CM | POA: Diagnosis not present

## 2021-08-01 DIAGNOSIS — E7800 Pure hypercholesterolemia, unspecified: Secondary | ICD-10-CM | POA: Diagnosis not present

## 2021-08-01 DIAGNOSIS — E785 Hyperlipidemia, unspecified: Secondary | ICD-10-CM | POA: Diagnosis not present

## 2021-08-01 DIAGNOSIS — D013 Carcinoma in situ of anus and anal canal: Secondary | ICD-10-CM | POA: Diagnosis not present

## 2021-08-01 DIAGNOSIS — N3010 Interstitial cystitis (chronic) without hematuria: Secondary | ICD-10-CM | POA: Diagnosis not present

## 2021-08-01 DIAGNOSIS — M859 Disorder of bone density and structure, unspecified: Secondary | ICD-10-CM | POA: Diagnosis not present

## 2021-08-01 MED ORDER — TRAZODONE HCL 50 MG PO TABS
50.0000 mg | ORAL_TABLET | Freq: Every evening | ORAL | 0 refills | Status: DC | PRN
  Filled 2021-08-01: qty 30, 30d supply, fill #0

## 2021-08-03 ENCOUNTER — Other Ambulatory Visit (HOSPITAL_COMMUNITY): Payer: Self-pay

## 2021-08-03 MED ORDER — ESTRADIOL 10 MCG VA TABS
1.0000 | ORAL_TABLET | VAGINAL | 3 refills | Status: DC
  Filled 2021-08-03: qty 24, 84d supply, fill #0

## 2021-08-04 ENCOUNTER — Other Ambulatory Visit (HOSPITAL_COMMUNITY): Payer: Self-pay

## 2021-08-04 MED ORDER — ESTRADIOL 0.1 MG/GM VA CREA
1.0000 g | TOPICAL_CREAM | VAGINAL | 3 refills | Status: AC
  Filled 2021-08-04: qty 42.5, 90d supply, fill #0
  Filled 2021-12-04: qty 42.5, 90d supply, fill #1

## 2021-08-07 ENCOUNTER — Other Ambulatory Visit (HOSPITAL_COMMUNITY): Payer: Self-pay

## 2021-08-08 ENCOUNTER — Other Ambulatory Visit (HOSPITAL_COMMUNITY): Payer: Self-pay

## 2021-08-09 ENCOUNTER — Other Ambulatory Visit (HOSPITAL_COMMUNITY): Payer: Self-pay

## 2021-08-10 ENCOUNTER — Other Ambulatory Visit (HOSPITAL_COMMUNITY): Payer: Self-pay

## 2021-08-11 ENCOUNTER — Other Ambulatory Visit (HOSPITAL_COMMUNITY): Payer: Self-pay

## 2021-08-16 ENCOUNTER — Other Ambulatory Visit (HOSPITAL_COMMUNITY): Payer: Self-pay

## 2021-08-17 ENCOUNTER — Other Ambulatory Visit (HOSPITAL_COMMUNITY): Payer: Self-pay

## 2021-08-18 ENCOUNTER — Other Ambulatory Visit (HOSPITAL_COMMUNITY): Payer: Self-pay

## 2021-08-22 ENCOUNTER — Other Ambulatory Visit (HOSPITAL_COMMUNITY): Payer: Self-pay

## 2021-08-23 ENCOUNTER — Other Ambulatory Visit (HOSPITAL_COMMUNITY): Payer: Self-pay

## 2021-08-29 ENCOUNTER — Other Ambulatory Visit (HOSPITAL_COMMUNITY): Payer: Self-pay

## 2021-08-30 ENCOUNTER — Other Ambulatory Visit (HOSPITAL_COMMUNITY): Payer: Self-pay

## 2021-08-31 ENCOUNTER — Other Ambulatory Visit (HOSPITAL_COMMUNITY): Payer: Self-pay

## 2021-08-31 MED ORDER — PANTOPRAZOLE SODIUM 40 MG PO TBEC
40.0000 mg | DELAYED_RELEASE_TABLET | Freq: Every day | ORAL | 3 refills | Status: DC
  Filled 2021-08-31: qty 90, 90d supply, fill #0
  Filled 2022-07-31: qty 90, 90d supply, fill #1

## 2021-09-08 ENCOUNTER — Other Ambulatory Visit (HOSPITAL_COMMUNITY): Payer: Self-pay

## 2021-09-11 ENCOUNTER — Other Ambulatory Visit (HOSPITAL_COMMUNITY): Payer: Self-pay

## 2021-09-11 MED ORDER — VENLAFAXINE HCL 100 MG PO TABS
100.0000 mg | ORAL_TABLET | Freq: Every day | ORAL | 1 refills | Status: AC
  Filled 2021-09-11: qty 90, 90d supply, fill #0
  Filled 2021-12-28: qty 90, 90d supply, fill #1

## 2021-09-12 ENCOUNTER — Other Ambulatory Visit (HOSPITAL_COMMUNITY): Payer: Self-pay

## 2021-09-13 ENCOUNTER — Other Ambulatory Visit (HOSPITAL_COMMUNITY): Payer: Self-pay

## 2021-09-13 MED ORDER — HYOSCYAMINE SULFATE ER 0.375 MG PO TB12
0.3750 mg | ORAL_TABLET | Freq: Every day | ORAL | 4 refills | Status: AC
  Filled 2021-09-13: qty 30, 30d supply, fill #0

## 2021-09-14 ENCOUNTER — Other Ambulatory Visit: Payer: Self-pay

## 2021-09-15 ENCOUNTER — Other Ambulatory Visit (HOSPITAL_COMMUNITY): Payer: Self-pay

## 2021-09-19 ENCOUNTER — Other Ambulatory Visit: Payer: Self-pay | Admitting: Obstetrics and Gynecology

## 2021-09-19 DIAGNOSIS — Z1231 Encounter for screening mammogram for malignant neoplasm of breast: Secondary | ICD-10-CM

## 2021-09-21 ENCOUNTER — Other Ambulatory Visit (HOSPITAL_COMMUNITY): Payer: Self-pay

## 2021-09-27 ENCOUNTER — Other Ambulatory Visit (HOSPITAL_COMMUNITY): Payer: Self-pay

## 2021-09-29 ENCOUNTER — Telehealth: Payer: Medicare Other | Admitting: Physician Assistant

## 2021-09-29 ENCOUNTER — Other Ambulatory Visit (HOSPITAL_COMMUNITY): Payer: Self-pay

## 2021-09-29 DIAGNOSIS — S90861A Insect bite (nonvenomous), right foot, initial encounter: Secondary | ICD-10-CM

## 2021-09-29 DIAGNOSIS — W57XXXA Bitten or stung by nonvenomous insect and other nonvenomous arthropods, initial encounter: Secondary | ICD-10-CM | POA: Diagnosis not present

## 2021-09-29 DIAGNOSIS — L0390 Cellulitis, unspecified: Secondary | ICD-10-CM

## 2021-09-29 MED ORDER — SULFAMETHOXAZOLE-TRIMETHOPRIM 800-160 MG PO TABS
1.0000 | ORAL_TABLET | Freq: Two times a day (BID) | ORAL | 0 refills | Status: AC
  Filled 2021-09-29: qty 14, 7d supply, fill #0

## 2021-09-29 NOTE — Progress Notes (Signed)
E Visit for Cellulitis  We are sorry that you are not feeling well. Here is how we plan to help!  Based on what you shared with me it looks like you have cellulitis.  Cellulitis looks like areas of skin redness, swelling, and warmth; it develops as a result of bacteria entering under the skin. Little red spots and/or bleeding can be seen in skin, and tiny surface sacs containing fluid can occur. Fever can be present. Cellulitis is almost always on one side of a body, and the lower limbs are the most common site of involvement.   I have prescribed:  Bactrim DS 1 tablet by mouth twice a day for 7 days  HOME CARE:  Take your medications as ordered and take all of them, even if the skin irritation appears to be healing.   GET HELP RIGHT AWAY IF:  Symptoms that don't begin to go away within 48 hours. Severe redness persists or worsens If the area turns color, spreads or swells. If it blisters and opens, develops yellow-brown crust or bleeds. You develop a fever or chills. If the pain increases or becomes unbearable.  Are unable to keep fluids and food down.  MAKE SURE YOU   Understand these instructions. Will watch your condition. Will get help right away if you are not doing well or get worse.  Thank you for choosing an e-visit.  Your e-visit answers were reviewed by a board certified advanced clinical practitioner to complete your personal care plan. Depending upon the condition, your plan could have included both over the counter or prescription medications.  Please review your pharmacy choice. Make sure the pharmacy is open so you can pick up prescription now. If there is a problem, you may contact your provider through [employer] and have the prescription routed to another pharmacy.  Your safety is important to [place]. If you have drug allergies check your prescription carefully.   For the next 24 hours you can use MyChart to ask questions about today's visit, request a  non-urgent call back, or ask for a work or school excuse. You will get an email in the next two days asking about your experience. I hope that your e-visit has been valuable and will speed your recovery.  I provided 5 minutes of non face-to-face time during this encounter for chart review and documentation.

## 2021-09-30 ENCOUNTER — Other Ambulatory Visit (HOSPITAL_COMMUNITY): Payer: Self-pay

## 2021-10-02 ENCOUNTER — Other Ambulatory Visit (HOSPITAL_COMMUNITY): Payer: Self-pay

## 2021-10-02 MED ORDER — TRAZODONE HCL 50 MG PO TABS
50.0000 mg | ORAL_TABLET | Freq: Every evening | ORAL | 0 refills | Status: DC | PRN
  Filled 2021-10-02: qty 30, 30d supply, fill #0

## 2021-10-04 ENCOUNTER — Other Ambulatory Visit (HOSPITAL_COMMUNITY): Payer: Self-pay

## 2021-10-05 ENCOUNTER — Other Ambulatory Visit (HOSPITAL_COMMUNITY): Payer: Self-pay

## 2021-10-09 ENCOUNTER — Other Ambulatory Visit (HOSPITAL_COMMUNITY): Payer: Self-pay

## 2021-10-09 ENCOUNTER — Other Ambulatory Visit: Payer: Self-pay | Admitting: Interventional Cardiology

## 2021-10-09 MED ORDER — ROSUVASTATIN CALCIUM 10 MG PO TABS
10.0000 mg | ORAL_TABLET | Freq: Every day | ORAL | 3 refills | Status: DC
  Filled 2021-10-09: qty 25, 25d supply, fill #0
  Filled 2021-10-09: qty 61, 61d supply, fill #0
  Filled 2021-10-09 (×2): qty 29, 29d supply, fill #0
  Filled 2021-10-09: qty 65, 65d supply, fill #0

## 2021-10-10 ENCOUNTER — Other Ambulatory Visit (HOSPITAL_COMMUNITY): Payer: Self-pay

## 2021-10-11 ENCOUNTER — Other Ambulatory Visit (HOSPITAL_COMMUNITY): Payer: Self-pay

## 2021-10-20 ENCOUNTER — Ambulatory Visit
Admission: RE | Admit: 2021-10-20 | Discharge: 2021-10-20 | Disposition: A | Discharge status: Home / Self Care | Payer: Medicare Other | Source: Ambulatory Visit | Attending: Obstetrics and Gynecology | Admitting: Obstetrics and Gynecology

## 2021-10-20 DIAGNOSIS — Z1231 Encounter for screening mammogram for malignant neoplasm of breast: Secondary | ICD-10-CM

## 2021-10-24 ENCOUNTER — Other Ambulatory Visit: Payer: Self-pay | Admitting: Obstetrics and Gynecology

## 2021-10-24 DIAGNOSIS — R928 Other abnormal and inconclusive findings on diagnostic imaging of breast: Secondary | ICD-10-CM

## 2021-10-26 ENCOUNTER — Ambulatory Visit
Admission: RE | Admit: 2021-10-26 | Discharge: 2021-10-26 | Disposition: A | Discharge status: Home / Self Care | Payer: Medicare Other | Source: Ambulatory Visit | Attending: Obstetrics and Gynecology | Admitting: Obstetrics and Gynecology

## 2021-10-26 ENCOUNTER — Ambulatory Visit: Payer: Medicare Other

## 2021-10-26 DIAGNOSIS — R928 Other abnormal and inconclusive findings on diagnostic imaging of breast: Secondary | ICD-10-CM

## 2021-10-26 DIAGNOSIS — R922 Inconclusive mammogram: Secondary | ICD-10-CM | POA: Diagnosis not present

## 2021-10-31 ENCOUNTER — Other Ambulatory Visit: Payer: Medicare Other

## 2021-11-13 ENCOUNTER — Other Ambulatory Visit (HOSPITAL_COMMUNITY): Payer: Self-pay

## 2021-11-14 ENCOUNTER — Other Ambulatory Visit (HOSPITAL_COMMUNITY): Payer: Self-pay

## 2021-11-14 DIAGNOSIS — N3010 Interstitial cystitis (chronic) without hematuria: Secondary | ICD-10-CM | POA: Diagnosis not present

## 2021-11-14 DIAGNOSIS — R1084 Generalized abdominal pain: Secondary | ICD-10-CM | POA: Diagnosis not present

## 2021-11-14 DIAGNOSIS — I700 Atherosclerosis of aorta: Secondary | ICD-10-CM | POA: Diagnosis not present

## 2021-11-14 DIAGNOSIS — R109 Unspecified abdominal pain: Secondary | ICD-10-CM | POA: Diagnosis not present

## 2021-11-14 DIAGNOSIS — N1330 Unspecified hydronephrosis: Secondary | ICD-10-CM | POA: Diagnosis not present

## 2021-11-16 ENCOUNTER — Other Ambulatory Visit (HOSPITAL_COMMUNITY): Payer: Self-pay

## 2021-11-17 ENCOUNTER — Other Ambulatory Visit (HOSPITAL_COMMUNITY): Payer: Self-pay

## 2021-11-17 MED ORDER — METOPROLOL SUCCINATE ER 25 MG PO TB24
12.5000 mg | ORAL_TABLET | Freq: Every day | ORAL | 3 refills | Status: DC
  Filled 2021-11-17: qty 30, 60d supply, fill #0

## 2021-11-17 MED ORDER — HYDROCHLOROTHIAZIDE 12.5 MG PO CAPS
12.5000 mg | ORAL_CAPSULE | Freq: Every day | ORAL | 3 refills | Status: DC
Start: 2021-11-17 — End: 2022-11-19
  Filled 2021-11-17: qty 90, 90d supply, fill #0
  Filled 2022-02-16: qty 90, 90d supply, fill #1
  Filled 2022-05-16: qty 90, 90d supply, fill #2
  Filled 2022-08-15: qty 90, 90d supply, fill #3

## 2021-11-21 ENCOUNTER — Other Ambulatory Visit (HOSPITAL_COMMUNITY): Payer: Self-pay

## 2021-12-04 ENCOUNTER — Other Ambulatory Visit (HOSPITAL_COMMUNITY): Payer: Self-pay

## 2021-12-05 ENCOUNTER — Other Ambulatory Visit (HOSPITAL_COMMUNITY): Payer: Self-pay

## 2021-12-05 MED ORDER — TRAZODONE HCL 50 MG PO TABS
50.0000 mg | ORAL_TABLET | Freq: Every evening | ORAL | 0 refills | Status: DC | PRN
Start: 2021-12-05 — End: 2022-02-28
  Filled 2021-12-05: qty 30, 30d supply, fill #0

## 2021-12-06 ENCOUNTER — Other Ambulatory Visit (HOSPITAL_COMMUNITY): Payer: Self-pay

## 2021-12-08 ENCOUNTER — Other Ambulatory Visit (HOSPITAL_COMMUNITY): Payer: Self-pay

## 2021-12-08 MED ORDER — LOSARTAN POTASSIUM 50 MG PO TABS
50.0000 mg | ORAL_TABLET | Freq: Every day | ORAL | 0 refills | Status: DC
  Filled 2021-12-08: qty 90, 90d supply, fill #0

## 2021-12-15 ENCOUNTER — Other Ambulatory Visit (HOSPITAL_COMMUNITY): Payer: Self-pay

## 2021-12-15 DIAGNOSIS — R350 Frequency of micturition: Secondary | ICD-10-CM | POA: Diagnosis not present

## 2021-12-15 DIAGNOSIS — R3915 Urgency of urination: Secondary | ICD-10-CM | POA: Diagnosis not present

## 2021-12-15 DIAGNOSIS — N3010 Interstitial cystitis (chronic) without hematuria: Secondary | ICD-10-CM | POA: Diagnosis not present

## 2021-12-15 MED ORDER — GEMTESA 75 MG PO TABS
1.0000 | ORAL_TABLET | Freq: Every day | ORAL | 6 refills | Status: AC
  Filled 2021-12-15: qty 30, 30d supply, fill #0

## 2021-12-20 ENCOUNTER — Other Ambulatory Visit (HOSPITAL_COMMUNITY): Payer: Self-pay

## 2021-12-26 ENCOUNTER — Other Ambulatory Visit (HOSPITAL_COMMUNITY): Payer: Self-pay

## 2021-12-27 ENCOUNTER — Other Ambulatory Visit (HOSPITAL_COMMUNITY): Payer: Self-pay

## 2021-12-28 ENCOUNTER — Other Ambulatory Visit (HOSPITAL_COMMUNITY): Payer: Self-pay

## 2021-12-29 ENCOUNTER — Encounter: Payer: Self-pay | Admitting: Interventional Cardiology

## 2021-12-29 ENCOUNTER — Ambulatory Visit: Payer: Medicare Other | Attending: Interventional Cardiology | Admitting: Interventional Cardiology

## 2021-12-29 VITALS — BP 112/72 | HR 71 | Ht 63.0 in | Wt 125.6 lb

## 2021-12-29 DIAGNOSIS — I2584 Coronary atherosclerosis due to calcified coronary lesion: Secondary | ICD-10-CM

## 2021-12-29 DIAGNOSIS — E782 Mixed hyperlipidemia: Secondary | ICD-10-CM

## 2021-12-29 DIAGNOSIS — I10 Essential (primary) hypertension: Secondary | ICD-10-CM

## 2021-12-29 DIAGNOSIS — I2510 Atherosclerotic heart disease of native coronary artery without angina pectoris: Secondary | ICD-10-CM | POA: Diagnosis not present

## 2021-12-29 DIAGNOSIS — Z8249 Family history of ischemic heart disease and other diseases of the circulatory system: Secondary | ICD-10-CM

## 2021-12-29 DIAGNOSIS — I452 Bifascicular block: Secondary | ICD-10-CM | POA: Diagnosis not present

## 2021-12-29 NOTE — Progress Notes (Signed)
Cardiology Office Note   Date:  12/29/2021   ID:  April Rodriguez, DOB [birth year], MRN [id number]  PCP:  Leeroy Cha, MD    No chief complaint on file.  Family history of coronary artery disease/coronary calcification  Wt Readings from Last 3 Encounters:  12/29/21 125 lb 9.6 oz (57 kg)  10/20/20 124 lb (56.2 kg)  10/12/19 130 lb (59 kg)       History of Present Illness: April Rodriguez is a 66 y.o. female  had prior cardiac cath back in Jul 06, 2015 for chest pain - this showed just very mild nonobstructive CAD. FH quite + for CAD - sister with prior stents Arlice Colt who is my patient also.) Other issues include HLD, HTN and GERD.    She has had some intermittent chest pain related to stress with dealing with elderly parents. Father had dementia and mother with prior stroke. They ended up being placed in long term care facility and both have subsequently passed away. Last seen in February 05, 2023. BP ok. Lots of stress with COVID issues. Palpitations better with beta blocker therapy.    02/2019 Cardiac CT was done and was reassuring.:   "Calcium Score: 40 Agatston units.   Coronary Arteries: Right dominant with no anomalies   LM: No plaque or stenosis.   LAD system: Mixed plaque proximal LAD, mild (<50%) stenosis.   Circumflex system: No plaque or stenosis.   RCA system: No plaque or stenosis.   IMPRESSION: 1. Coronary artery calcium score 40 Agatston units. This places the patient in the 75th percentile for age and gender, suggesting intermediate risk for future cardiac events.   2.  Nonobstructive CAD."  She still works at [employer].   Her husband passed away in July 05, 2020 from cancer.   She retired from [employer].    Denies : Chest pain. Dizziness. Leg edema. Nitroglycerin use. Orthopnea. Palpitations. Paroxysmal nocturnal dyspnea. Shortness of breath. Syncope.      Past Medical History:  Diagnosis Date   AIN grade III    Allergic rhinitis    Anxiety    Chronic  constipation    Depression    Family history of adverse reaction to anesthesia    sister gets nauseated   Frequency of urination    GERD (gastroesophageal reflux disease)    Headache    migraines   History of hiatal hernia    Hypertension    IBS (irritable bowel syndrome)    Rash    UNDER BREAST    Past Surgical History:  Procedure Laterality Date   AUSTIN BUNIONECTOMY RIGHT FOOT  03-07-2000   BREAST BIOPSY     CARDIAC CATHETERIZATION N/A 10/21/2015   Procedure: Left Heart Cath and Coronary Angiography;  Surgeon: Jettie Booze, MD;  Location: [facility];  Service: Cardiovascular;  Laterality: N/A;   CATARACT EXTRACTION W/ INTRAOCULAR LENS  IMPLANT, BILATERAL  2009/07/05   COLONOSCOPY  12-15-2009   COLONOSCOPY N/A 04/09/2017   Procedure: COLONOSCOPY;  Surgeon: Ronnette Juniper, MD;  Location: [facility];  Service: Gastroenterology;  Laterality: N/A;   COLONOSCOPY WITH PROPOFOL N/A 03/08/2016   Procedure: COLONOSCOPY WITH PROPOFOL;  Surgeon: Garlan Fair, MD;  Location: [facility];  Service: Endoscopy;  Laterality: N/A;   CYSTO/  BLADDER BX/  HYDRODISTENTION  01-30-2006   ESOPHAGOGASTRODUODENOSCOPY N/A 04/09/2017   Procedure: ESOPHAGOGASTRODUODENOSCOPY (EGD);  Surgeon: Ronnette Juniper, MD;  Location: [facility];  Service: Gastroenterology;  Laterality: N/A;   HEMORROIDECTOMY  1976   LEFT BREAST  BX  1980's   BENIGN   RECTAL EXAM UNDER ANESTHESIA N/A 12/10/2014   Procedure: ANAL EXAM UNDER ANESTHESIA  EXCISIONAL BIOPSY OF PERIANAL NEOPLASM;  Surgeon: [name], MD;  Location: [facility];  Service: General;  Laterality: N/A;   TUBAL LIGATION  1980's     Current Outpatient Medications  Medication Sig Dispense Refill   aspirin EC 81 MG tablet Take 1 tablet (81 mg total) by mouth daily. 90 tablet 3   aspirin-acetaminophen-caffeine (EXCEDRIN MIGRAINE) 250-250-65 MG tablet Take 1 tablet by mouth daily as needed for headache.     clotrimazole-betamethasone  (LOTRISONE) cream Apply 1 application topically as needed (rash).      docusate sodium (COLACE) 100 MG capsule Take 100 mg by mouth 2 (two) times daily as needed for mild constipation.     estradiol (ESTRACE) 0.1 MG/GM vaginal cream Place 1 gram vaginally 2 (two) times a week as directed. 42.5 g 3   FIBER PO Take 1 capsule by mouth daily as needed (constipation).     fluticasone (FLONASE) 50 MCG/ACT nasal spray Place 2 sprays daily into both nostrils. 16 g 6   hydrochlorothiazide (MICROZIDE) 12.5 MG capsule Take 12.5 mg by mouth daily.     hyoscyamine (LEVBID) 0.375 MG 12 hr tablet Take 1 tablet (0.375 mg total) by mouth daily. 30 tablet 4   ibuprofen (ADVIL,MOTRIN) 200 MG tablet Take 200-400 mg by mouth daily as needed for headache or moderate pain.     levocetirizine (XYZAL) 5 MG tablet Take 5 mg by mouth daily as needed for allergies. If Claritin doesn't work     loratadine (CLARITIN) 10 MG tablet Take 10 mg by mouth daily as needed for allergies.     losartan (COZAAR) 50 MG tablet Take 50 mg by mouth daily.     meclizine (ANTIVERT) 25 MG tablet Take 25 mg by mouth as needed (DIZZINESS).      Melatonin 5 MG TABS Take 5 mg by mouth at bedtime as needed (sleep).     metoprolol succinate (TOPROL-XL) 25 MG 24 hr tablet Take 0.5 tablets (12.5 mg total) by mouth at bedtime. 45 tablet 4   MINERAL OIL PO Take by mouth as needed (2 TABLESPOONS  AS FOR CONSTIPATION).     mometasone (ELOCON) 0.1 % cream Apply a small amount to affected area twice a day 15 g 1   ondansetron (ZOFRAN) 4 MG tablet Take 1 tablet by mouth 2 times a day, as needed for 7 days 15 tablet 0   pantoprazole (PROTONIX) 40 MG tablet Take 1 tablet (40 mg total) by mouth daily. 90 tablet 3   saccharomyces boulardii (FLORASTOR) 250 MG capsule Take 1 capsule by mouth 2 times a day 60 capsule 1   sulfamethoxazole-trimethoprim (BACTRIM DS) 800-160 MG tablet Take 1 tablet by mouth 2 (two) times daily. 14 tablet 0   tiZANidine (ZANAFLEX) 4 MG  tablet Take 4 mg by mouth 3 (three) times daily as needed for muscle spasms.     topiramate (TOPAMAX) 25 MG tablet Take 1 tablet (25 mg total) by mouth daily. 90 tablet 4   traZODone (DESYREL) 50 MG tablet Take 1 tablet (50 mg total) by mouth at bedtime as needed. 30 tablet 0   venlafaxine (EFFEXOR) 100 MG tablet Take 1 tablet (100 mg total) by mouth daily with food 90 tablet 1   Vibegron (GEMTESA) 75 MG TABS Take 1 tablet by mouth daily. 30 tablet 6   rosuvastatin (CRESTOR) 10 MG tablet  TAKE 1 TABLET (10 MG TOTAL) BY MOUTH DAILY. 30 tablet 11   No current facility-administered medications for this visit.    Allergies:   Erythromycin    Social History:  The patient  reports that she has never smoked. She has never used smokeless tobacco. She reports that she does not drink alcohol and does not use drugs.   Family History:  The patient's family history includes Aneurysm in her sister; Breast cancer (age of onset: 83) in her mother; Emphysema in her sister; Heart attack in her mother; Lung cancer in her sister; Prostate cancer in her father; Rheum arthritis in her mother; Stroke in her brother and mother.    ROS:  Please see the history of present illness.   Otherwise, review of systems are positive for wanting something to do after retirement.   All other systems are reviewed and negative.    PHYSICAL EXAM: VS:  BP 112/72   Pulse 71   Ht '5\' 3"'$  (1.6 m)   Wt 125 lb 9.6 oz (57 kg)   SpO2 97%   BMI 22.25 kg/m  , BMI Body mass index is 22.25 kg/m. GEN: Well nourished, well developed, in no acute distress HEENT: normal Neck: no JVD, carotid bruits, or masses Cardiac: RRR; no murmurs, rubs, or gallops,no edema  Respiratory:  clear to auscultation bilaterally, normal work of breathing GI: soft, nontender, nondistended, + BS MS: no deformity or atrophy Skin: warm and dry, no rash Neuro:  Strength and sensation are intact Psych: euthymic mood, full affect   EKG:   The ekg ordered  today demonstrates NSR, RBBB, LAFB, no change from 2022.    Recent Labs: No results found for requested labs within last 365 days.   Lipid Panel    Component Value Date/Time   CHOL 132 12/01/2019 0824   TRIG 113 12/01/2019 0824   HDL 52 12/01/2019 0824   CHOLHDL 2.5 12/01/2019 0824   LDLCALC 60 12/01/2019 0824     Other studies Reviewed: Additional studies/ records that were reviewed today with results demonstrating: labs reviewed.   ASSESSMENT AND PLAN:  CAD/coronary calcification: Mild in the past.  Coronary calcium score was 75th percentile.  No angina.  Continue aggressive secondary prevention. Hypertension: The current medical regimen is effective;  continue present plan and medications. Hyperlipidemia: Whole food, plant-based diet.  High-fiber diet.  Avoid processed foods.  LDL 55.  Continue rosuvastatin. RBBB/LAFB: ECG unchanged Family history of coronary artery disease: No change.     Current medicines are reviewed at length with the patient today.  The patient concerns regarding her medicines were addressed.  The following changes have been made:  No change  Labs/ tests ordered today include:  No orders of the defined types were placed in this encounter.   Recommend 150 minutes/week of aerobic exercise Low fat, low carb, high fiber diet recommended  Disposition:   FU in 1 year   Signed, [name], MD  12/29/2021 2:49 PM    [facility] [address], [place], [place]  [address] Phone: [phone]; Fax: [phone]

## 2021-12-29 NOTE — Patient Instructions (Signed)
Medication Instructions:  Your physician recommends that you continue on your current medications as directed. Please refer to the Current Medication list given to you today.  *If you need a refill on your cardiac medications before your next appointment, please call your pharmacy*   Lab Work: none If you have labs (blood work) drawn today and your tests are completely normal, you will receive your results only by: MyChart Message (if you have MyChart) OR A paper copy in the mail If you have any lab test that is abnormal or we need to change your treatment, we will call you to review the results.   Testing/Procedures: none   Follow-Up: At [facility], you and your health needs are our priority.  As part of our continuing mission to provide you with exceptional heart care, we have created designated Provider Care Teams.  These Care Teams include your primary Cardiologist (physician) and Advanced Practice Providers (APPs -  Physician Assistants and Nurse Practitioners) who all work together to provide you with the care you need, when you need it.  We recommend signing up for the patient portal called "MyChart".  Sign up information is provided on this After Visit Summary.  MyChart is used to connect with patients for Virtual Visits (Telemedicine).  Patients are able to view lab/test results, encounter notes, upcoming appointments, etc.  Non-urgent messages can be sent to your provider as well.   To learn more about what you can do with MyChart, go to [email or web address].    Your next appointment:   12 month(s)  The format for your next appointment:   In Person  Provider:   Jayadeep Varanasi, MD     Other Instructions    Important Information About Sugar       

## 2022-01-01 ENCOUNTER — Other Ambulatory Visit: Payer: Self-pay | Admitting: Interventional Cardiology

## 2022-01-01 ENCOUNTER — Other Ambulatory Visit (HOSPITAL_COMMUNITY): Payer: Self-pay

## 2022-01-01 MED ORDER — ROSUVASTATIN CALCIUM 10 MG PO TABS
10.0000 mg | ORAL_TABLET | Freq: Every day | ORAL | 11 refills | Status: DC
  Filled 2022-01-01: qty 30, 30d supply, fill #0
  Filled 2022-02-05: qty 30, 30d supply, fill #1
  Filled 2022-03-11: qty 30, 30d supply, fill #2
  Filled 2022-04-26: qty 30, 30d supply, fill #3
  Filled 2022-05-24: qty 90, 90d supply, fill #4
  Filled 2022-08-19: qty 90, 90d supply, fill #5
  Filled 2022-11-26: qty 60, 60d supply, fill #6

## 2022-01-16 ENCOUNTER — Ambulatory Visit: Payer: Medicare Other | Admitting: Interventional Cardiology

## 2022-01-23 DIAGNOSIS — N3010 Interstitial cystitis (chronic) without hematuria: Secondary | ICD-10-CM | POA: Diagnosis not present

## 2022-01-29 ENCOUNTER — Other Ambulatory Visit: Payer: Self-pay | Admitting: Internal Medicine

## 2022-01-29 DIAGNOSIS — J301 Allergic rhinitis due to pollen: Secondary | ICD-10-CM | POA: Diagnosis not present

## 2022-01-29 DIAGNOSIS — I10 Essential (primary) hypertension: Secondary | ICD-10-CM | POA: Diagnosis not present

## 2022-01-29 DIAGNOSIS — G43909 Migraine, unspecified, not intractable, without status migrainosus: Secondary | ICD-10-CM | POA: Diagnosis not present

## 2022-01-29 DIAGNOSIS — E7800 Pure hypercholesterolemia, unspecified: Secondary | ICD-10-CM | POA: Diagnosis not present

## 2022-01-29 DIAGNOSIS — E785 Hyperlipidemia, unspecified: Secondary | ICD-10-CM | POA: Diagnosis not present

## 2022-01-29 DIAGNOSIS — Z0000 Encounter for general adult medical examination without abnormal findings: Secondary | ICD-10-CM | POA: Diagnosis not present

## 2022-01-29 DIAGNOSIS — G4700 Insomnia, unspecified: Secondary | ICD-10-CM | POA: Diagnosis not present

## 2022-01-29 DIAGNOSIS — N3010 Interstitial cystitis (chronic) without hematuria: Secondary | ICD-10-CM | POA: Diagnosis not present

## 2022-01-29 DIAGNOSIS — M2550 Pain in unspecified joint: Secondary | ICD-10-CM | POA: Diagnosis not present

## 2022-01-29 DIAGNOSIS — M859 Disorder of bone density and structure, unspecified: Secondary | ICD-10-CM | POA: Diagnosis not present

## 2022-01-30 ENCOUNTER — Other Ambulatory Visit (HOSPITAL_COMMUNITY): Payer: Self-pay

## 2022-01-30 MED ORDER — NITROFURANTOIN MONOHYD MACRO 100 MG PO CAPS
100.0000 mg | ORAL_CAPSULE | Freq: Two times a day (BID) | ORAL | 0 refills | Status: AC
  Filled 2022-01-30: qty 10, 5d supply, fill #0

## 2022-02-02 DIAGNOSIS — N3010 Interstitial cystitis (chronic) without hematuria: Secondary | ICD-10-CM | POA: Diagnosis not present

## 2022-02-05 ENCOUNTER — Other Ambulatory Visit (HOSPITAL_COMMUNITY): Payer: Self-pay

## 2022-02-06 ENCOUNTER — Encounter: Payer: Self-pay | Admitting: Internal Medicine

## 2022-02-09 ENCOUNTER — Encounter: Payer: Self-pay | Admitting: Internal Medicine

## 2022-02-09 ENCOUNTER — Other Ambulatory Visit: Payer: Self-pay | Admitting: Internal Medicine

## 2022-02-09 DIAGNOSIS — M8580 Other specified disorders of bone density and structure, unspecified site: Secondary | ICD-10-CM

## 2022-02-16 ENCOUNTER — Other Ambulatory Visit (HOSPITAL_COMMUNITY): Payer: Self-pay

## 2022-02-16 ENCOUNTER — Other Ambulatory Visit (HOSPITAL_COMMUNITY): Payer: Self-pay | Admitting: Adult Health

## 2022-02-16 MED ORDER — LOSARTAN POTASSIUM 50 MG PO TABS
50.0000 mg | ORAL_TABLET | Freq: Every day | ORAL | 2 refills | Status: DC
  Filled 2022-03-13: qty 90, 90d supply, fill #0
  Filled 2022-06-19: qty 90, 90d supply, fill #1
  Filled 2022-09-18: qty 90, 90d supply, fill #2

## 2022-02-20 ENCOUNTER — Other Ambulatory Visit (HOSPITAL_COMMUNITY): Payer: Self-pay | Admitting: Adult Health

## 2022-02-20 ENCOUNTER — Other Ambulatory Visit (HOSPITAL_COMMUNITY): Payer: Self-pay

## 2022-02-21 ENCOUNTER — Other Ambulatory Visit (HOSPITAL_COMMUNITY): Payer: Self-pay

## 2022-02-21 ENCOUNTER — Other Ambulatory Visit (HOSPITAL_COMMUNITY): Payer: Self-pay | Admitting: Adult Health

## 2022-02-21 MED ORDER — URIBEL 118 MG PO CAPS
1.0000 | ORAL_CAPSULE | Freq: Two times a day (BID) | ORAL | 3 refills | Status: AC | PRN
  Filled 2022-02-21: qty 20, 10d supply, fill #0

## 2022-02-28 ENCOUNTER — Other Ambulatory Visit (HOSPITAL_COMMUNITY): Payer: Self-pay

## 2022-02-28 MED ORDER — TRAZODONE HCL 50 MG PO TABS
50.0000 mg | ORAL_TABLET | Freq: Every evening | ORAL | 0 refills | Status: DC | PRN
  Filled 2022-02-28: qty 30, 30d supply, fill #0

## 2022-03-01 ENCOUNTER — Other Ambulatory Visit (HOSPITAL_COMMUNITY): Payer: Self-pay

## 2022-03-13 ENCOUNTER — Other Ambulatory Visit (HOSPITAL_COMMUNITY): Payer: Self-pay

## 2022-03-20 ENCOUNTER — Other Ambulatory Visit (HOSPITAL_COMMUNITY): Payer: Self-pay

## 2022-03-20 DIAGNOSIS — D225 Melanocytic nevi of trunk: Secondary | ICD-10-CM | POA: Diagnosis not present

## 2022-03-20 DIAGNOSIS — L738 Other specified follicular disorders: Secondary | ICD-10-CM | POA: Diagnosis not present

## 2022-03-20 DIAGNOSIS — L821 Other seborrheic keratosis: Secondary | ICD-10-CM | POA: Diagnosis not present

## 2022-03-20 DIAGNOSIS — L812 Freckles: Secondary | ICD-10-CM | POA: Diagnosis not present

## 2022-03-20 MED ORDER — TRETINOIN 0.025 % EX CREA
TOPICAL_CREAM | Freq: Every evening | CUTANEOUS | 2 refills | Status: AC
  Filled 2022-03-20: qty 20, 30d supply, fill #0

## 2022-03-21 ENCOUNTER — Other Ambulatory Visit (HOSPITAL_COMMUNITY): Payer: Self-pay

## 2022-03-21 DIAGNOSIS — R059 Cough, unspecified: Secondary | ICD-10-CM | POA: Diagnosis not present

## 2022-03-21 DIAGNOSIS — U071 COVID-19: Secondary | ICD-10-CM | POA: Diagnosis not present

## 2022-03-21 MED ORDER — PAXLOVID (300/100) 20 X 150 MG & 10 X 100MG PO TBPK
ORAL_TABLET | Freq: Two times a day (BID) | ORAL | 0 refills | Status: DC
  Filled 2022-03-21: qty 30, 5d supply, fill #0

## 2022-03-22 ENCOUNTER — Other Ambulatory Visit (HOSPITAL_COMMUNITY): Payer: Self-pay

## 2022-03-25 DIAGNOSIS — J029 Acute pharyngitis, unspecified: Secondary | ICD-10-CM | POA: Diagnosis not present

## 2022-03-25 DIAGNOSIS — J019 Acute sinusitis, unspecified: Secondary | ICD-10-CM | POA: Diagnosis not present

## 2022-03-29 ENCOUNTER — Other Ambulatory Visit (HOSPITAL_COMMUNITY): Payer: Self-pay

## 2022-04-01 ENCOUNTER — Other Ambulatory Visit (HOSPITAL_COMMUNITY): Payer: Self-pay

## 2022-04-03 ENCOUNTER — Other Ambulatory Visit (HOSPITAL_COMMUNITY): Payer: Self-pay

## 2022-04-03 MED ORDER — TOPIRAMATE 25 MG PO TABS
25.0000 mg | ORAL_TABLET | Freq: Every day | ORAL | 4 refills | Status: DC
  Filled 2022-04-03: qty 90, 90d supply, fill #0
  Filled 2022-06-29: qty 90, 90d supply, fill #1
  Filled 2022-09-24 (×2): qty 90, 90d supply, fill #2
  Filled 2022-12-25: qty 90, 90d supply, fill #3

## 2022-04-04 ENCOUNTER — Other Ambulatory Visit (HOSPITAL_COMMUNITY): Payer: Self-pay

## 2022-04-06 ENCOUNTER — Other Ambulatory Visit (HOSPITAL_COMMUNITY): Payer: Self-pay

## 2022-04-10 ENCOUNTER — Other Ambulatory Visit (HOSPITAL_COMMUNITY): Payer: Self-pay

## 2022-04-10 MED ORDER — BUSPIRONE HCL 5 MG PO TABS
5.0000 mg | ORAL_TABLET | Freq: Two times a day (BID) | ORAL | 5 refills | Status: DC
  Filled 2022-04-10: qty 60, 30d supply, fill #0
  Filled 2022-08-15: qty 60, 30d supply, fill #1
  Filled 2022-12-10: qty 60, 30d supply, fill #2
  Filled 2023-02-20: qty 60, 30d supply, fill #3

## 2022-04-11 ENCOUNTER — Other Ambulatory Visit (HOSPITAL_COMMUNITY): Payer: Self-pay

## 2022-04-11 MED ORDER — METOPROLOL SUCCINATE ER 25 MG PO TB24
12.5000 mg | ORAL_TABLET | Freq: Every day | ORAL | 4 refills | Status: DC
  Filled 2022-04-11: qty 45, 90d supply, fill #0
  Filled 2022-07-18: qty 45, 90d supply, fill #1
  Filled 2022-10-18: qty 45, 90d supply, fill #2
  Filled 2023-01-17: qty 45, 90d supply, fill #3

## 2022-04-27 ENCOUNTER — Other Ambulatory Visit (HOSPITAL_COMMUNITY): Payer: Self-pay

## 2022-04-30 ENCOUNTER — Other Ambulatory Visit (HOSPITAL_COMMUNITY): Payer: Self-pay

## 2022-04-30 MED ORDER — TRAZODONE HCL 50 MG PO TABS
50.0000 mg | ORAL_TABLET | Freq: Every evening | ORAL | 2 refills | Status: DC | PRN
  Filled 2022-04-30: qty 30, 30d supply, fill #0
  Filled 2022-06-30: qty 30, 30d supply, fill #1
  Filled 2022-10-22: qty 30, 30d supply, fill #2

## 2022-05-24 ENCOUNTER — Other Ambulatory Visit (HOSPITAL_COMMUNITY): Payer: Self-pay

## 2022-05-25 ENCOUNTER — Other Ambulatory Visit (HOSPITAL_COMMUNITY): Payer: Self-pay

## 2022-06-12 DIAGNOSIS — N3010 Interstitial cystitis (chronic) without hematuria: Secondary | ICD-10-CM | POA: Diagnosis not present

## 2022-06-22 DIAGNOSIS — H52203 Unspecified astigmatism, bilateral: Secondary | ICD-10-CM | POA: Diagnosis not present

## 2022-06-22 DIAGNOSIS — Z961 Presence of intraocular lens: Secondary | ICD-10-CM | POA: Diagnosis not present

## 2022-07-10 ENCOUNTER — Other Ambulatory Visit: Payer: Self-pay

## 2022-07-10 ENCOUNTER — Other Ambulatory Visit (HOSPITAL_COMMUNITY): Payer: Self-pay

## 2022-07-10 DIAGNOSIS — M25561 Pain in right knee: Secondary | ICD-10-CM | POA: Diagnosis not present

## 2022-07-10 MED ORDER — MELOXICAM 7.5 MG PO TABS
7.5000 mg | ORAL_TABLET | Freq: Every day | ORAL | 2 refills | Status: DC
  Filled 2022-07-10 (×2): qty 30, 30d supply, fill #0

## 2022-07-31 ENCOUNTER — Other Ambulatory Visit (HOSPITAL_COMMUNITY): Payer: Self-pay

## 2022-07-31 ENCOUNTER — Other Ambulatory Visit: Payer: Self-pay | Admitting: Internal Medicine

## 2022-07-31 ENCOUNTER — Ambulatory Visit
Admission: RE | Admit: 2022-07-31 | Discharge: 2022-07-31 | Disposition: A | Discharge status: Home / Self Care | Payer: Medicare Other | Source: Ambulatory Visit | Attending: Internal Medicine | Admitting: Internal Medicine

## 2022-07-31 DIAGNOSIS — R1032 Left lower quadrant pain: Secondary | ICD-10-CM | POA: Diagnosis not present

## 2022-07-31 DIAGNOSIS — I10 Essential (primary) hypertension: Secondary | ICD-10-CM | POA: Diagnosis not present

## 2022-07-31 DIAGNOSIS — R052 Subacute cough: Secondary | ICD-10-CM | POA: Diagnosis not present

## 2022-07-31 DIAGNOSIS — R5383 Other fatigue: Secondary | ICD-10-CM | POA: Diagnosis not present

## 2022-07-31 DIAGNOSIS — R634 Abnormal weight loss: Secondary | ICD-10-CM | POA: Diagnosis not present

## 2022-07-31 DIAGNOSIS — I700 Atherosclerosis of aorta: Secondary | ICD-10-CM | POA: Diagnosis not present

## 2022-08-01 ENCOUNTER — Other Ambulatory Visit (HOSPITAL_COMMUNITY): Payer: Self-pay

## 2022-08-01 ENCOUNTER — Ambulatory Visit
Admission: RE | Admit: 2022-08-01 | Discharge: 2022-08-01 | Disposition: A | Discharge status: Home / Self Care | Payer: Medicare Other | Source: Ambulatory Visit | Attending: Internal Medicine | Admitting: Internal Medicine

## 2022-08-01 DIAGNOSIS — M810 Age-related osteoporosis without current pathological fracture: Secondary | ICD-10-CM | POA: Diagnosis not present

## 2022-08-01 DIAGNOSIS — M8580 Other specified disorders of bone density and structure, unspecified site: Secondary | ICD-10-CM | POA: Diagnosis not present

## 2022-08-01 MED ORDER — CIPROFLOXACIN HCL 500 MG PO TABS
500.0000 mg | ORAL_TABLET | Freq: Two times a day (BID) | ORAL | 0 refills | Status: AC
  Filled 2022-08-01: qty 14, 7d supply, fill #0

## 2022-09-06 ENCOUNTER — Other Ambulatory Visit (HOSPITAL_COMMUNITY): Payer: Self-pay

## 2022-09-06 MED ORDER — ESTRADIOL 0.1 MG/GM VA CREA
1.0000 g | TOPICAL_CREAM | VAGINAL | 3 refills | Status: AC
  Filled 2022-09-06: qty 42.5, 90d supply, fill #0

## 2022-09-19 ENCOUNTER — Other Ambulatory Visit (HOSPITAL_COMMUNITY): Payer: Self-pay

## 2022-09-24 ENCOUNTER — Other Ambulatory Visit: Payer: Self-pay

## 2022-09-24 ENCOUNTER — Other Ambulatory Visit: Payer: Self-pay | Admitting: Internal Medicine

## 2022-09-24 ENCOUNTER — Other Ambulatory Visit (HOSPITAL_COMMUNITY): Payer: Self-pay

## 2022-09-24 DIAGNOSIS — Z1231 Encounter for screening mammogram for malignant neoplasm of breast: Secondary | ICD-10-CM

## 2022-10-23 ENCOUNTER — Other Ambulatory Visit (HOSPITAL_COMMUNITY): Payer: Self-pay

## 2022-10-23 ENCOUNTER — Ambulatory Visit
Admission: RE | Admit: 2022-10-23 | Discharge: 2022-10-23 | Disposition: A | Discharge status: Home / Self Care | Payer: Medicare Other | Source: Ambulatory Visit | Attending: Internal Medicine | Admitting: Internal Medicine

## 2022-10-23 DIAGNOSIS — Z1231 Encounter for screening mammogram for malignant neoplasm of breast: Secondary | ICD-10-CM | POA: Diagnosis not present

## 2022-11-19 ENCOUNTER — Other Ambulatory Visit (HOSPITAL_COMMUNITY): Payer: Self-pay

## 2022-11-19 MED ORDER — HYDROCHLOROTHIAZIDE 12.5 MG PO CAPS
12.5000 mg | ORAL_CAPSULE | Freq: Every day | ORAL | 1 refills | Status: DC
  Filled 2022-11-19: qty 90, 90d supply, fill #0
  Filled 2023-02-17: qty 90, 90d supply, fill #1

## 2022-11-26 ENCOUNTER — Other Ambulatory Visit (HOSPITAL_COMMUNITY): Payer: Self-pay

## 2022-12-13 ENCOUNTER — Other Ambulatory Visit (HOSPITAL_COMMUNITY): Payer: Self-pay

## 2022-12-17 ENCOUNTER — Other Ambulatory Visit (HOSPITAL_COMMUNITY): Payer: Self-pay

## 2022-12-17 MED ORDER — LOSARTAN POTASSIUM 50 MG PO TABS
50.0000 mg | ORAL_TABLET | Freq: Every day | ORAL | 2 refills | Status: DC
  Filled 2022-12-17: qty 90, 90d supply, fill #0
  Filled 2023-03-18: qty 90, 90d supply, fill #1
  Filled 2023-06-09: qty 90, 90d supply, fill #2

## 2022-12-25 DIAGNOSIS — M25561 Pain in right knee: Secondary | ICD-10-CM | POA: Diagnosis not present

## 2023-01-20 ENCOUNTER — Other Ambulatory Visit: Payer: Self-pay | Admitting: Interventional Cardiology

## 2023-01-23 ENCOUNTER — Other Ambulatory Visit (HOSPITAL_COMMUNITY): Payer: Self-pay

## 2023-01-23 MED ORDER — ROSUVASTATIN CALCIUM 10 MG PO TABS
10.0000 mg | ORAL_TABLET | Freq: Every day | ORAL | 0 refills | Status: DC
  Filled 2023-01-23: qty 90, 90d supply, fill #0

## 2023-01-27 ENCOUNTER — Other Ambulatory Visit (HOSPITAL_COMMUNITY): Payer: Self-pay

## 2023-01-28 ENCOUNTER — Other Ambulatory Visit (HOSPITAL_COMMUNITY): Payer: Self-pay

## 2023-01-28 MED ORDER — TRAZODONE HCL 50 MG PO TABS
50.0000 mg | ORAL_TABLET | Freq: Every evening | ORAL | 2 refills | Status: AC | PRN
  Filled 2023-01-28: qty 30, 30d supply, fill #0
  Filled 2023-06-09: qty 30, 30d supply, fill #1
  Filled 2023-08-30: qty 30, 30d supply, fill #2

## 2023-01-30 ENCOUNTER — Other Ambulatory Visit (HOSPITAL_COMMUNITY): Payer: Self-pay

## 2023-02-04 DIAGNOSIS — K219 Gastro-esophageal reflux disease without esophagitis: Secondary | ICD-10-CM | POA: Diagnosis not present

## 2023-02-04 DIAGNOSIS — Z0000 Encounter for general adult medical examination without abnormal findings: Secondary | ICD-10-CM | POA: Diagnosis not present

## 2023-02-04 DIAGNOSIS — R5383 Other fatigue: Secondary | ICD-10-CM | POA: Diagnosis not present

## 2023-02-04 DIAGNOSIS — E785 Hyperlipidemia, unspecified: Secondary | ICD-10-CM | POA: Diagnosis not present

## 2023-02-04 DIAGNOSIS — D013 Carcinoma in situ of anus and anal canal: Secondary | ICD-10-CM | POA: Diagnosis not present

## 2023-02-04 DIAGNOSIS — K5904 Chronic idiopathic constipation: Secondary | ICD-10-CM | POA: Diagnosis not present

## 2023-02-04 DIAGNOSIS — N3010 Interstitial cystitis (chronic) without hematuria: Secondary | ICD-10-CM | POA: Diagnosis not present

## 2023-02-04 DIAGNOSIS — Z23 Encounter for immunization: Secondary | ICD-10-CM | POA: Diagnosis not present

## 2023-02-04 DIAGNOSIS — I10 Essential (primary) hypertension: Secondary | ICD-10-CM | POA: Diagnosis not present

## 2023-02-04 DIAGNOSIS — R7989 Other specified abnormal findings of blood chemistry: Secondary | ICD-10-CM | POA: Diagnosis not present

## 2023-02-04 DIAGNOSIS — G43909 Migraine, unspecified, not intractable, without status migrainosus: Secondary | ICD-10-CM | POA: Diagnosis not present

## 2023-02-04 DIAGNOSIS — M859 Disorder of bone density and structure, unspecified: Secondary | ICD-10-CM | POA: Diagnosis not present

## 2023-02-05 ENCOUNTER — Ambulatory Visit: Payer: Medicare Other | Admitting: Interventional Cardiology

## 2023-02-12 DIAGNOSIS — E538 Deficiency of other specified B group vitamins: Secondary | ICD-10-CM | POA: Diagnosis not present

## 2023-02-18 DIAGNOSIS — E538 Deficiency of other specified B group vitamins: Secondary | ICD-10-CM | POA: Diagnosis not present

## 2023-02-25 DIAGNOSIS — E538 Deficiency of other specified B group vitamins: Secondary | ICD-10-CM | POA: Diagnosis not present

## 2023-03-04 DIAGNOSIS — E538 Deficiency of other specified B group vitamins: Secondary | ICD-10-CM | POA: Diagnosis not present

## 2023-03-14 DIAGNOSIS — M25561 Pain in right knee: Secondary | ICD-10-CM | POA: Diagnosis not present

## 2023-03-19 ENCOUNTER — Other Ambulatory Visit: Payer: Self-pay

## 2023-03-19 MED ORDER — CYCLOBENZAPRINE HCL 10 MG PO TABS
10.0000 mg | ORAL_TABLET | ORAL | 0 refills | Status: AC
  Filled 2023-03-19: qty 60, 15d supply, fill #0

## 2023-03-19 MED ORDER — MELOXICAM 15 MG PO TABS
15.0000 mg | ORAL_TABLET | Freq: Every day | ORAL | 1 refills | Status: AC
  Filled 2023-03-19: qty 30, 30d supply, fill #0

## 2023-04-05 ENCOUNTER — Other Ambulatory Visit (HOSPITAL_COMMUNITY): Payer: Self-pay

## 2023-04-05 MED ORDER — TOPIRAMATE 25 MG PO TABS
25.0000 mg | ORAL_TABLET | Freq: Every day | ORAL | 4 refills | Status: AC
  Filled 2023-04-05: qty 90, 90d supply, fill #0
  Filled 2023-06-24: qty 90, 90d supply, fill #1
  Filled 2023-09-21: qty 90, 90d supply, fill #2
  Filled 2023-12-12: qty 90, 90d supply, fill #3
  Filled 2024-03-23: qty 90, 90d supply, fill #4

## 2023-04-08 DIAGNOSIS — E538 Deficiency of other specified B group vitamins: Secondary | ICD-10-CM | POA: Diagnosis not present

## 2023-04-11 NOTE — Progress Notes (Signed)
 Chief Complaint  Patient presents with   Follow-up    CAD    History of Present Illness: 68 yo female with history of CAD, HTN, HLD and GERD who is here today for follow up. She has been followed in our office by Dr. Dann. Cardiac cath in 2017 with mild CAD. Coronary CTA in 2020 with mild LAD disease. 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   She is here today for follow up. The patient denies any chest pain, dyspnea, palpitations, lower extremity edema, orthopnea, PND, dizziness, near syncope or syncope.   Her granddaughter Damien works in the cath lab.   Primary Care Physician: Cleotilde, [place]  E, PA   Past Medical History:  Diagnosis Date   AIN grade III    Allergic rhinitis    Anxiety    Chronic constipation    Depression    Family history of adverse reaction to anesthesia    sister gets nauseated   Frequency of urination    GERD (gastroesophageal reflux disease)    Headache    migraines   History of hiatal hernia    Hypertension    IBS (irritable bowel syndrome)    Rash    UNDER BREAST    Past Surgical History:  Procedure Laterality Date   AUSTIN BUNIONECTOMY RIGHT FOOT  03-07-2000   BREAST BIOPSY     CARDIAC CATHETERIZATION N/A 10/21/2015   Procedure: Left Heart Cath and Coronary Angiography;  Surgeon: Candyce GORMAN Dann, MD;  Location: [facility] INVASIVE CV LAB;  Service: Cardiovascular;  Laterality: N/A;   CATARACT EXTRACTION W/ INTRAOCULAR LENS  IMPLANT, BILATERAL  2011   COLONOSCOPY  12-15-2009   COLONOSCOPY N/A 04/09/2017   Procedure: COLONOSCOPY;  Surgeon: Saintclair Jasper, MD;  Location: [facility];  Service: Gastroenterology;  Laterality: N/A;   COLONOSCOPY WITH PROPOFOL  N/A 03/08/2016   Procedure: COLONOSCOPY WITH PROPOFOL ;  Surgeon: Gladis MARLA Louder, MD;  Location: [facility];  Service: Endoscopy;  Laterality: N/A;   CYSTO/  BLADDER BX/  HYDRODISTENTION  01-30-2006   ESOPHAGOGASTRODUODENOSCOPY N/A 04/09/2017   Procedure: ESOPHAGOGASTRODUODENOSCOPY (EGD);   Surgeon: Saintclair Jasper, MD;  Location: [facility];  Service: Gastroenterology;  Laterality: N/A;   HEMORROIDECTOMY  1976   LEFT BREAST BX  1980's   BENIGN   RECTAL EXAM UNDER ANESTHESIA N/A 12/10/2014   Procedure: ANAL EXAM UNDER ANESTHESIA  EXCISIONAL BIOPSY OF PERIANAL NEOPLASM;  Surgeon: Bernarda Ned, MD;  Location: [facility];  Service: General;  Laterality: N/A;   TUBAL LIGATION  1980's    Current Outpatient Medications  Medication Sig Dispense Refill   aspirin  EC 81 MG tablet Take 1 tablet (81 mg total) by mouth daily. 90 tablet 3   busPIRone  (BUSPAR ) 5 MG tablet Take 1 tablet (5 mg total) by mouth 2 (two) times daily. 60 tablet 5   clotrimazole-betamethasone  (LOTRISONE) cream Apply 1 application topically as needed (rash).      cyclobenzaprine  (FLEXERIL ) 10 MG tablet Take 1 tablet (10 mg total) by mouth every 6 to 8 hours. 60 tablet 0   docusate sodium (COLACE) 100 MG capsule Take 100 mg by mouth 2 (two) times daily as needed for mild constipation.     estradiol  (ESTRACE ) 0.1 MG/GM vaginal cream Place 1 gram vaginally 2 (two) times a week as directed. 42.5 g 3   FIBER PO Take 1 capsule by mouth daily as needed (constipation).     fluticasone  (FLONASE ) 50 MCG/ACT nasal spray Place 2 sprays daily into both  nostrils. 16 g 6   hydrochlorothiazide  (MICROZIDE ) 12.5 MG capsule Take 1 capsule (12.5 mg total) by mouth daily. 90 capsule 1   hyoscyamine  (LEVBID ) 0.375 MG 12 hr tablet Take 1 tablet (0.375 mg total) by mouth daily. 30 tablet 4   ibuprofen (ADVIL,MOTRIN) 200 MG tablet Take 200-400 mg by mouth daily as needed for headache or moderate pain.     levocetirizine (XYZAL ) 5 MG tablet Take 5 mg by mouth daily as needed for allergies. If Claritin doesn't work     loratadine (CLARITIN) 10 MG tablet Take 10 mg by mouth daily as needed for allergies.     losartan  (COZAAR ) 50 MG tablet Take 1 tablet (50 mg total) by mouth daily. 90 tablet 2   meclizine (ANTIVERT) 25 MG tablet  Take 25 mg by mouth as needed (DIZZINESS).      Melatonin 5 MG TABS Take 5 mg by mouth at bedtime as needed (sleep).     meloxicam  (MOBIC ) 15 MG tablet Take 1 tablet (15 mg total) by mouth daily with food. 30 tablet 1   meloxicam  (MOBIC ) 7.5 MG tablet Take 1 tablet (7.5 mg total) by mouth daily with a meal. 30 tablet 2   Meth-Hyo-M Bl-Na Phos-Ph Sal (URIBEL ) 118 MG CAPS Take 1 capsule (118 mg total) by mouth 2 (two) times daily as needed. 20 capsule 3   metoprolol  succinate (TOPROL -XL) 25 MG 24 hr tablet Take 1/2 tablet (12.5 mg total) by mouth at bedtime. 45 tablet 4   MINERAL OIL PO Take by mouth as needed (2 TABLESPOONS  AS FOR CONSTIPATION).     mometasone  (ELOCON ) 0.1 % cream Apply a small amount to affected area twice a day 15 g 1   nitrofurantoin , macrocrystal-monohydrate, (MACROBID ) 100 MG capsule Take 1 capsule (100 mg total) by mouth every 12 (twelve) hours with food. 10 capsule 0   ondansetron  (ZOFRAN ) 4 MG tablet Take 1 tablet by mouth 2 times a day, as needed for 7 days 15 tablet 0   pantoprazole  (PROTONIX ) 40 MG tablet Take 1 tablet (40 mg total) by mouth daily. 90 tablet 3   rosuvastatin  (CRESTOR ) 10 MG tablet Take 1 tablet (10 mg total) by mouth daily. Pt needs to keep upcoming appt in Jan, 2025 for further refills 90 tablet 0   saccharomyces boulardii (FLORASTOR) 250 MG capsule Take 1 capsule by mouth 2 times a day (Patient taking differently: Take 250 mg by mouth as needed.) 60 capsule 1   tiZANidine (ZANAFLEX) 4 MG tablet Take 4 mg by mouth 3 (three) times daily as needed for muscle spasms.     topiramate  (TOPAMAX ) 25 MG tablet Take 1 tablet (25 mg total) by mouth daily. 90 tablet 4   traZODone  (DESYREL ) 50 MG tablet Take 1 tablet (50 mg total) by mouth at bedtime as needed. 30 tablet 2   tretinoin  (RETIN-A ) 0.025 % cream Apply a small amount to affected area every night 20 g 2   venlafaxine  (EFFEXOR ) 100 MG tablet Take 1 tablet (100 mg total) by mouth daily with food 90 tablet 1    Vibegron  (GEMTESA ) 75 MG TABS Take 1 tablet by mouth daily. 30 tablet 6   aspirin -acetaminophen -caffeine (EXCEDRIN MIGRAINE) 250-250-65 MG tablet Take 1 tablet by mouth daily as needed for headache. (Patient not taking: Reported on 04/12/2023)     ciprofloxacin  (CIPRO ) 500 MG tablet Take 1 tablet (500 mg total) by mouth every 12 (twelve) hours for 7 days (Patient not taking: Reported on 04/12/2023) 14  tablet 0   estradiol  (ESTRACE ) 0.1 MG/GM vaginal cream Insert 1 gram vaginally 2 (two) times a week as directed (Patient not taking: Reported on 04/12/2023) 42.5 g 3   hydrochlorothiazide  (MICROZIDE ) 12.5 MG capsule Take 12.5 mg by mouth daily. (Patient not taking: Reported on 04/12/2023)     losartan  (COZAAR ) 50 MG tablet Take 50 mg by mouth daily. (Patient not taking: Reported on 04/12/2023)     sulfamethoxazole -trimethoprim  (BACTRIM  DS) 800-160 MG tablet Take 1 tablet by mouth 2 (two) times daily. (Patient not taking: Reported on 04/12/2023) 14 tablet 0   No current facility-administered medications for this visit.    Allergies  Allergen Reactions   Erythromycin Diarrhea    GI upset    Social History   Socioeconomic History   Marital status: Widowed    Spouse name: married   Number of children: Y   Years of education: Not on file   Highest education level: Not on file  Occupational History   Occupation: admin. assistance    Employer: [employer]  Tobacco Use   Smoking status: Never   Smokeless tobacco: Never  Substance and Sexual Activity   Alcohol use: No   Drug use: No   Sexual activity: Not on file  Other Topics Concern   Not on file  Social History Narrative   Not on file   Social Drivers of Health   Financial Resource Strain: Not on file  Food Insecurity: Not on file  Transportation Needs: Not on file  Physical Activity: Not on file  Stress: Not on file  Social Connections: Not on file  Intimate Partner Violence: Not on file    Family History  Problem Relation  Age of Onset   Rheum arthritis Mother    Breast cancer Mother 89   Heart attack Mother    Stroke Mother    Prostate cancer Father    Emphysema Sister    Stroke Brother    Lung cancer Sister        Reena Fair   Aneurysm Sister        brain    Review of Systems:  As stated in the HPI and otherwise negative.   BP (!) 120/90 (BP Location: Left Arm, Patient Position: Sitting, Cuff Size: Normal)   Pulse 68   Ht 5' 3 (1.6 m)   Wt 57.7 kg   SpO2 98%   BMI 22.53 kg/m   Physical Examination: General: Well developed, well nourished, NAD  HEENT: OP clear, mucus membranes moist  SKIN: warm, dry. No rashes. Neuro: No focal deficits  Musculoskeletal: Muscle strength 5/5 all ext  Psychiatric: Mood and affect normal  Neck: No JVD, no carotid bruits, no thyromegaly, no lymphadenopathy.  Lungs:Clear bilaterally, no wheezes, rhonci, crackles Cardiovascular: Regular rate and rhythm. No murmurs, gallops or rubs. Abdomen:Soft. Bowel sounds present. Non-tender.  Extremities: No lower extremity edema. Pulses are 2 + in the bilateral DP/PT.  EKG:  EKG is ordered today. The ekg ordered today demonstrates  EKG Interpretation Date/Time:  Friday April 12 2023 09:38:42 EST Ventricular Rate:  68 PR Interval:  176 QRS Duration:  104 QT Interval:  440 QTC Calculation: 467 R Axis:   -66  Text Interpretation: Normal sinus rhythm Low voltage QRS Incomplete right bundle branch block Left anterior fascicular block  Poor R wave progression  Confirmed by Verlin Bruckner [phone]) on 04/12/2023 9:45:54 AM   Recent Labs: No results found for requested labs within last 365 days.   Lipid Panel  Component Value Date/Time   CHOL 132 12/01/2019 0824   TRIG 113 12/01/2019 0824   HDL 52 12/01/2019 0824   CHOLHDL 2.5 12/01/2019 0824   LDLCALC 60 12/01/2019 0824     Wt Readings from Last 3 Encounters:  04/12/23 57.7 kg  12/29/21 57 kg  10/20/20 56.2 kg    Assessment and Plan:   1. CAD  without angina: No chest pain suggestive of angina. Mild CAD by cath in 2017 and by coronary CTA in 2020. Continue ASA, beta blocker and statin  2. HTN: BP is well controlled. No changes today  3. HLD: LDL 64 in November 2024. Continue statin  4. RBBB: Chronic  Labs/ tests ordered today include:   Orders Placed This Encounter  Procedures   EKG 12-Lead   Disposition:   F/U with me in 12 months   Signed, Lonni Cash, MD, [facility] 04/12/2023 10:07 AM    [facility] [address] [place], [place], [place]  [address] Phone: [phone]; Fax: [phone]

## 2023-04-12 ENCOUNTER — Encounter: Payer: Self-pay | Admitting: Cardiovascular Disease

## 2023-04-12 ENCOUNTER — Ambulatory Visit: Payer: Medicare Other | Attending: Interventional Cardiology | Admitting: Cardiovascular Disease

## 2023-04-12 VITALS — BP 120/90 | HR 68 | Ht 63.0 in | Wt 127.2 lb

## 2023-04-12 DIAGNOSIS — I452 Bifascicular block: Secondary | ICD-10-CM

## 2023-04-12 DIAGNOSIS — I10 Essential (primary) hypertension: Secondary | ICD-10-CM | POA: Diagnosis not present

## 2023-04-12 DIAGNOSIS — I2510 Atherosclerotic heart disease of native coronary artery without angina pectoris: Secondary | ICD-10-CM | POA: Diagnosis not present

## 2023-04-12 DIAGNOSIS — E782 Mixed hyperlipidemia: Secondary | ICD-10-CM | POA: Diagnosis not present

## 2023-04-12 NOTE — Patient Instructions (Signed)
 Medication Instructions:  No changes *If you need a refill on your cardiac medications before your next appointment, please call your pharmacy*   Lab Work: none If you have labs (blood work) drawn today and your tests are completely normal, you will receive your results only by: MyChart Message (if you have MyChart) OR A paper copy in the mail If you have any lab test that is abnormal or we need to change your treatment, we will call you to review the results.   Testing/Procedures: none   Follow-Up: At [facility], you and your health needs are our priority.  As part of our continuing mission to provide you with exceptional heart care, we have created designated Provider Care Teams.  These Care Teams include your primary Cardiologist (physician) and Advanced Practice Providers (APPs -  Physician Assistants and Nurse Practitioners) who all work together to provide you with the care you need, when you need it.  We recommend signing up for the patient portal called MyChart.  Sign up information is provided on this After Visit Summary.  MyChart is used to connect with patients for Virtual Visits (Telemedicine).  Patients are able to view lab/test results, encounter notes, upcoming appointments, etc.  Non-urgent messages can be sent to your provider as well.   To learn more about what you can do with MyChart, go to [email or web address].    Your next appointment:   12 month(s)  Provider:   Lonni Cash, MD  Other Instructions   1st Floor: - Lobby - Registration  - Pharmacy  - Lab - Cafe  2nd Floor: - PV Lab - Diagnostic Testing (echo, CT, nuclear med)  3rd Floor: - Vacant  4th Floor: - TCTS (cardiothoracic surgery) - AFib Clinic - Structural Heart Clinic - Vascular Surgery  - Vascular Ultrasound  5th Floor: - HeartCare Cardiology (general and EP) - Clinical Pharmacy for coumadin, hypertension, lipid, weight-loss medications, and med management  appointments    Valet parking services will be available as well.

## 2023-04-16 ENCOUNTER — Other Ambulatory Visit: Payer: Self-pay | Admitting: Cardiovascular Disease

## 2023-04-16 ENCOUNTER — Other Ambulatory Visit (HOSPITAL_COMMUNITY): Payer: Self-pay

## 2023-04-16 ENCOUNTER — Other Ambulatory Visit: Payer: Self-pay

## 2023-04-16 MED ORDER — ROSUVASTATIN CALCIUM 10 MG PO TABS
10.0000 mg | ORAL_TABLET | Freq: Every day | ORAL | 0 refills | Status: DC
  Filled 2023-04-16: qty 90, 90d supply, fill #0

## 2023-04-16 MED ORDER — METOPROLOL SUCCINATE ER 25 MG PO TB24
12.5000 mg | ORAL_TABLET | Freq: Every day | ORAL | 4 refills | Status: DC
  Filled 2023-04-16: qty 45, 90d supply, fill #0
  Filled 2023-07-19: qty 45, 90d supply, fill #1
  Filled 2023-10-17: qty 45, 90d supply, fill #2
  Filled 2024-01-17: qty 45, 90d supply, fill #3

## 2023-04-18 DIAGNOSIS — M1711 Unilateral primary osteoarthritis, right knee: Secondary | ICD-10-CM | POA: Diagnosis not present

## 2023-04-25 DIAGNOSIS — M1711 Unilateral primary osteoarthritis, right knee: Secondary | ICD-10-CM | POA: Diagnosis not present

## 2023-04-30 ENCOUNTER — Other Ambulatory Visit (HOSPITAL_COMMUNITY): Payer: Self-pay

## 2023-04-30 DIAGNOSIS — L821 Other seborrheic keratosis: Secondary | ICD-10-CM | POA: Diagnosis not present

## 2023-04-30 DIAGNOSIS — D225 Melanocytic nevi of trunk: Secondary | ICD-10-CM | POA: Diagnosis not present

## 2023-04-30 DIAGNOSIS — L812 Freckles: Secondary | ICD-10-CM | POA: Diagnosis not present

## 2023-04-30 MED ORDER — VALACYCLOVIR HCL 1 G PO TABS
2000.0000 mg | ORAL_TABLET | Freq: Two times a day (BID) | ORAL | 4 refills | Status: AC
  Filled 2023-04-30: qty 20, 5d supply, fill #0

## 2023-05-02 DIAGNOSIS — M1711 Unilateral primary osteoarthritis, right knee: Secondary | ICD-10-CM | POA: Diagnosis not present

## 2023-05-09 DIAGNOSIS — E538 Deficiency of other specified B group vitamins: Secondary | ICD-10-CM | POA: Diagnosis not present

## 2023-05-16 ENCOUNTER — Other Ambulatory Visit (HOSPITAL_COMMUNITY): Payer: Self-pay

## 2023-05-16 MED ORDER — HYDROCHLOROTHIAZIDE 12.5 MG PO CAPS
12.5000 mg | ORAL_CAPSULE | Freq: Every day | ORAL | 1 refills | Status: DC
  Filled 2023-05-16: qty 90, 90d supply, fill #0
  Filled 2023-08-15: qty 90, 90d supply, fill #1

## 2023-05-16 MED ORDER — PANTOPRAZOLE SODIUM 40 MG PO TBEC
40.0000 mg | DELAYED_RELEASE_TABLET | ORAL | 3 refills | Status: DC
  Filled 2023-05-16: qty 24, 84d supply, fill #0
  Filled 2023-07-24: qty 24, 84d supply, fill #1
  Filled 2023-09-21 – 2023-10-17 (×2): qty 24, 84d supply, fill #2
  Filled 2023-12-26: qty 24, 84d supply, fill #3

## 2023-05-23 ENCOUNTER — Other Ambulatory Visit (HOSPITAL_COMMUNITY): Payer: Self-pay

## 2023-05-23 MED ORDER — BUSPIRONE HCL 5 MG PO TABS
5.0000 mg | ORAL_TABLET | Freq: Two times a day (BID) | ORAL | 0 refills | Status: DC
  Filled 2023-05-23: qty 60, 30d supply, fill #0

## 2023-05-24 DIAGNOSIS — M25561 Pain in right knee: Secondary | ICD-10-CM | POA: Diagnosis not present

## 2023-06-06 DIAGNOSIS — M25561 Pain in right knee: Secondary | ICD-10-CM | POA: Diagnosis not present

## 2023-06-10 ENCOUNTER — Other Ambulatory Visit (HOSPITAL_COMMUNITY): Payer: Self-pay

## 2023-06-13 DIAGNOSIS — R350 Frequency of micturition: Secondary | ICD-10-CM | POA: Diagnosis not present

## 2023-06-13 DIAGNOSIS — R3915 Urgency of urination: Secondary | ICD-10-CM | POA: Diagnosis not present

## 2023-06-13 DIAGNOSIS — N3010 Interstitial cystitis (chronic) without hematuria: Secondary | ICD-10-CM | POA: Diagnosis not present

## 2023-06-17 DIAGNOSIS — E538 Deficiency of other specified B group vitamins: Secondary | ICD-10-CM | POA: Diagnosis not present

## 2023-06-18 DIAGNOSIS — K5900 Constipation, unspecified: Secondary | ICD-10-CM | POA: Diagnosis not present

## 2023-06-25 DIAGNOSIS — H26491 Other secondary cataract, right eye: Secondary | ICD-10-CM | POA: Diagnosis not present

## 2023-06-25 DIAGNOSIS — H5213 Myopia, bilateral: Secondary | ICD-10-CM | POA: Diagnosis not present

## 2023-06-25 DIAGNOSIS — Z961 Presence of intraocular lens: Secondary | ICD-10-CM | POA: Diagnosis not present

## 2023-07-17 DIAGNOSIS — H26491 Other secondary cataract, right eye: Secondary | ICD-10-CM | POA: Diagnosis not present

## 2023-07-22 ENCOUNTER — Encounter (HOSPITAL_COMMUNITY): Payer: Self-pay

## 2023-07-22 ENCOUNTER — Other Ambulatory Visit (HOSPITAL_COMMUNITY): Payer: Self-pay

## 2023-07-22 DIAGNOSIS — E538 Deficiency of other specified B group vitamins: Secondary | ICD-10-CM | POA: Diagnosis not present

## 2023-07-24 ENCOUNTER — Other Ambulatory Visit (HOSPITAL_COMMUNITY): Payer: Self-pay

## 2023-07-29 ENCOUNTER — Other Ambulatory Visit (HOSPITAL_COMMUNITY): Payer: Self-pay

## 2023-07-29 ENCOUNTER — Encounter (HOSPITAL_COMMUNITY): Payer: Self-pay

## 2023-07-30 ENCOUNTER — Other Ambulatory Visit (HOSPITAL_COMMUNITY): Payer: Self-pay

## 2023-07-31 ENCOUNTER — Encounter (HOSPITAL_COMMUNITY): Payer: Self-pay

## 2023-07-31 ENCOUNTER — Other Ambulatory Visit: Payer: Self-pay

## 2023-07-31 ENCOUNTER — Other Ambulatory Visit (HOSPITAL_COMMUNITY): Payer: Self-pay

## 2023-07-31 MED ORDER — HYOSCYAMINE SULFATE ER 0.375 MG PO TB12
0.3750 mg | ORAL_TABLET | Freq: Every day | ORAL | 3 refills | Status: AC
  Filled 2023-07-31: qty 30, 30d supply, fill #0
  Filled 2024-04-15: qty 30, 30d supply, fill #1

## 2023-08-01 ENCOUNTER — Other Ambulatory Visit: Payer: Self-pay | Admitting: Cardiovascular Disease

## 2023-08-02 ENCOUNTER — Other Ambulatory Visit (HOSPITAL_COMMUNITY): Payer: Self-pay

## 2023-08-02 MED ORDER — ROSUVASTATIN CALCIUM 10 MG PO TABS
10.0000 mg | ORAL_TABLET | Freq: Every day | ORAL | 2 refills | Status: DC
  Filled 2023-08-02: qty 90, 90d supply, fill #0
  Filled 2023-09-10 – 2023-10-17 (×3): qty 90, 90d supply, fill #1
  Filled 2024-01-17: qty 90, 90d supply, fill #2

## 2023-08-05 ENCOUNTER — Other Ambulatory Visit (HOSPITAL_COMMUNITY): Payer: Self-pay

## 2023-08-18 ENCOUNTER — Other Ambulatory Visit (HOSPITAL_COMMUNITY): Payer: Self-pay

## 2023-08-19 ENCOUNTER — Other Ambulatory Visit (HOSPITAL_COMMUNITY): Payer: Self-pay

## 2023-08-19 DIAGNOSIS — I10 Essential (primary) hypertension: Secondary | ICD-10-CM | POA: Diagnosis not present

## 2023-08-19 DIAGNOSIS — E538 Deficiency of other specified B group vitamins: Secondary | ICD-10-CM | POA: Diagnosis not present

## 2023-08-19 DIAGNOSIS — R350 Frequency of micturition: Secondary | ICD-10-CM | POA: Diagnosis not present

## 2023-08-19 DIAGNOSIS — K5900 Constipation, unspecified: Secondary | ICD-10-CM | POA: Diagnosis not present

## 2023-08-19 DIAGNOSIS — M6281 Muscle weakness (generalized): Secondary | ICD-10-CM | POA: Diagnosis not present

## 2023-08-19 MED ORDER — MELOXICAM 7.5 MG PO TABS
7.5000 mg | ORAL_TABLET | Freq: Every day | ORAL | 2 refills | Status: AC
  Filled 2023-08-19 (×2): qty 30, 30d supply, fill #0
  Filled 2024-04-25 – 2024-05-08 (×3): qty 30, 30d supply, fill #1

## 2023-09-01 ENCOUNTER — Other Ambulatory Visit (HOSPITAL_COMMUNITY): Payer: Self-pay

## 2023-09-02 ENCOUNTER — Other Ambulatory Visit (HOSPITAL_COMMUNITY): Payer: Self-pay

## 2023-09-02 MED ORDER — BUSPIRONE HCL 5 MG PO TABS
5.0000 mg | ORAL_TABLET | Freq: Two times a day (BID) | ORAL | 0 refills | Status: DC
  Filled 2023-09-02: qty 60, 30d supply, fill #0

## 2023-09-04 DIAGNOSIS — M6281 Muscle weakness (generalized): Secondary | ICD-10-CM | POA: Diagnosis not present

## 2023-09-04 DIAGNOSIS — K5900 Constipation, unspecified: Secondary | ICD-10-CM | POA: Diagnosis not present

## 2023-09-10 ENCOUNTER — Other Ambulatory Visit (HOSPITAL_COMMUNITY): Payer: Self-pay

## 2023-09-10 MED ORDER — LOSARTAN POTASSIUM 50 MG PO TABS
50.0000 mg | ORAL_TABLET | Freq: Every day | ORAL | 2 refills | Status: AC
  Filled 2023-09-10 – 2023-09-20 (×2): qty 90, 90d supply, fill #0
  Filled 2023-12-12: qty 90, 90d supply, fill #1
  Filled 2024-03-11: qty 90, 90d supply, fill #2

## 2023-09-20 ENCOUNTER — Other Ambulatory Visit: Payer: Self-pay

## 2023-09-20 ENCOUNTER — Other Ambulatory Visit (HOSPITAL_COMMUNITY): Payer: Self-pay

## 2023-09-20 ENCOUNTER — Other Ambulatory Visit: Payer: Self-pay | Admitting: Internal Medicine

## 2023-09-20 DIAGNOSIS — Z1231 Encounter for screening mammogram for malignant neoplasm of breast: Secondary | ICD-10-CM

## 2023-09-21 ENCOUNTER — Other Ambulatory Visit: Payer: Self-pay

## 2023-09-21 ENCOUNTER — Other Ambulatory Visit (HOSPITAL_COMMUNITY): Payer: Self-pay

## 2023-10-22 DIAGNOSIS — N3010 Interstitial cystitis (chronic) without hematuria: Secondary | ICD-10-CM | POA: Diagnosis not present

## 2023-10-28 ENCOUNTER — Ambulatory Visit
Admission: RE | Admit: 2023-10-28 | Discharge: 2023-10-28 | Disposition: A | Discharge status: Home / Self Care | Source: Ambulatory Visit | Attending: Internal Medicine | Admitting: Internal Medicine

## 2023-10-28 DIAGNOSIS — Z1231 Encounter for screening mammogram for malignant neoplasm of breast: Secondary | ICD-10-CM | POA: Diagnosis not present

## 2023-11-05 DIAGNOSIS — M6281 Muscle weakness (generalized): Secondary | ICD-10-CM | POA: Diagnosis not present

## 2023-11-05 DIAGNOSIS — K5900 Constipation, unspecified: Secondary | ICD-10-CM | POA: Diagnosis not present

## 2023-11-19 ENCOUNTER — Other Ambulatory Visit: Payer: Self-pay

## 2023-11-19 ENCOUNTER — Other Ambulatory Visit (HOSPITAL_COMMUNITY): Payer: Self-pay

## 2023-11-19 MED ORDER — HYDROCHLOROTHIAZIDE 12.5 MG PO CAPS
12.5000 mg | ORAL_CAPSULE | Freq: Every day | ORAL | 1 refills | Status: DC
  Filled 2023-11-19: qty 90, 90d supply, fill #0
  Filled 2024-02-14 – 2024-03-09 (×3): qty 90, 90d supply, fill #1

## 2023-11-19 MED ORDER — BUSPIRONE HCL 5 MG PO TABS
5.0000 mg | ORAL_TABLET | Freq: Two times a day (BID) | ORAL | 0 refills | Status: DC
  Filled 2023-11-19: qty 60, 30d supply, fill #0

## 2023-11-21 DIAGNOSIS — N3010 Interstitial cystitis (chronic) without hematuria: Secondary | ICD-10-CM | POA: Diagnosis not present

## 2023-12-30 ENCOUNTER — Other Ambulatory Visit (HOSPITAL_COMMUNITY): Payer: Self-pay

## 2024-01-07 DIAGNOSIS — K5904 Chronic idiopathic constipation: Secondary | ICD-10-CM | POA: Diagnosis not present

## 2024-01-27 DIAGNOSIS — R301 Vesical tenesmus: Secondary | ICD-10-CM | POA: Diagnosis not present

## 2024-02-05 ENCOUNTER — Other Ambulatory Visit (HOSPITAL_COMMUNITY): Payer: Self-pay

## 2024-02-05 MED ORDER — AMOXICILLIN-POT CLAVULANATE 875-125 MG PO TABS
1.0000 | ORAL_TABLET | Freq: Two times a day (BID) | ORAL | 0 refills | Status: AC
  Filled 2024-02-05: qty 14, 7d supply, fill #0

## 2024-02-05 MED ORDER — BENZONATATE 200 MG PO CAPS
200.0000 mg | ORAL_CAPSULE | Freq: Three times a day (TID) | ORAL | 0 refills | Status: DC | PRN
  Filled 2024-02-05: qty 15, 5d supply, fill #0

## 2024-02-25 ENCOUNTER — Other Ambulatory Visit (HOSPITAL_COMMUNITY): Payer: Self-pay

## 2024-03-09 ENCOUNTER — Other Ambulatory Visit (HOSPITAL_COMMUNITY): Payer: Self-pay

## 2024-03-09 ENCOUNTER — Other Ambulatory Visit: Payer: Self-pay

## 2024-03-23 ENCOUNTER — Other Ambulatory Visit (HOSPITAL_COMMUNITY): Payer: Self-pay

## 2024-03-24 ENCOUNTER — Other Ambulatory Visit (HOSPITAL_COMMUNITY): Payer: Self-pay

## 2024-03-24 ENCOUNTER — Other Ambulatory Visit: Payer: Self-pay

## 2024-03-24 MED ORDER — PANTOPRAZOLE SODIUM 40 MG PO TBEC
40.0000 mg | DELAYED_RELEASE_TABLET | ORAL | 3 refills | Status: AC
  Filled 2024-03-24: qty 24, 84d supply, fill #0

## 2024-03-26 ENCOUNTER — Other Ambulatory Visit (HOSPITAL_COMMUNITY): Payer: Self-pay

## 2024-03-27 ENCOUNTER — Other Ambulatory Visit (HOSPITAL_COMMUNITY): Payer: Self-pay

## 2024-03-27 MED ORDER — BUSPIRONE HCL 5 MG PO TABS
5.0000 mg | ORAL_TABLET | Freq: Two times a day (BID) | ORAL | 0 refills | Status: AC
  Filled 2024-03-27: qty 60, 30d supply, fill #0

## 2024-03-31 ENCOUNTER — Other Ambulatory Visit (HOSPITAL_COMMUNITY): Payer: Self-pay

## 2024-04-09 ENCOUNTER — Encounter: Payer: Self-pay | Admitting: Cardiovascular Disease

## 2024-04-15 ENCOUNTER — Other Ambulatory Visit (HOSPITAL_COMMUNITY): Payer: Self-pay

## 2024-04-20 ENCOUNTER — Other Ambulatory Visit (HOSPITAL_COMMUNITY): Payer: Self-pay

## 2024-04-21 ENCOUNTER — Other Ambulatory Visit (HOSPITAL_COMMUNITY): Payer: Self-pay

## 2024-04-21 MED ORDER — METOPROLOL SUCCINATE ER 25 MG PO TB24
12.5000 mg | ORAL_TABLET | Freq: Every day | ORAL | 4 refills | Status: AC
  Filled 2024-04-21: qty 45, 90d supply, fill #0

## 2024-04-22 ENCOUNTER — Encounter: Payer: Self-pay | Admitting: Cardiovascular Disease

## 2024-04-22 ENCOUNTER — Ambulatory Visit: Attending: Cardiology | Admitting: Cardiovascular Disease

## 2024-04-22 VITALS — BP 116/68 | HR 90 | Ht 63.0 in | Wt 133.0 lb

## 2024-04-22 DIAGNOSIS — E782 Mixed hyperlipidemia: Secondary | ICD-10-CM | POA: Diagnosis not present

## 2024-04-22 DIAGNOSIS — I452 Bifascicular block: Secondary | ICD-10-CM | POA: Diagnosis not present

## 2024-04-22 DIAGNOSIS — I10 Essential (primary) hypertension: Secondary | ICD-10-CM

## 2024-04-22 DIAGNOSIS — I2510 Atherosclerotic heart disease of native coronary artery without angina pectoris: Secondary | ICD-10-CM

## 2024-04-22 NOTE — Progress Notes (Signed)
 "   Chief Complaint  Patient presents with   Follow-up    CAD    History of Present Illness: 69 yo female with history of CAD, HTN, HLD and GERD who is here today for follow up. She has been followed in our office by Dr. Dann. Cardiac cath in 2017 with mild CAD. Coronary CTA in 2020 with mild LAD disease. 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   She is here today for follow up. The patient denies any chest pain, dyspnea, palpitations, lower extremity edema, orthopnea, PND, dizziness, near syncope or syncope.   Her granddaughter Damien works in the cath lab.   Primary Care Physician: Cleotilde, [place]  E, PA   Past Medical History:  Diagnosis Date   AIN grade III    Allergic rhinitis    Anxiety    Chronic constipation    Depression    Family history of adverse reaction to anesthesia    sister gets nauseated   Frequency of urination    GERD (gastroesophageal reflux disease)    Headache    migraines   History of hiatal hernia    Hypertension    IBS (irritable bowel syndrome)    Rash    UNDER BREAST    Past Surgical History:  Procedure Laterality Date   AUSTIN BUNIONECTOMY RIGHT FOOT  03-07-2000   BREAST BIOPSY     CARDIAC CATHETERIZATION N/A 10/21/2015   Procedure: Left Heart Cath and Coronary Angiography;  Surgeon: Candyce GORMAN Dann, MD;  Location: [facility] INVASIVE CV LAB;  Service: Cardiovascular;  Laterality: N/A;   CATARACT EXTRACTION W/ INTRAOCULAR LENS  IMPLANT, BILATERAL  2011   COLONOSCOPY  12-15-2009   COLONOSCOPY N/A 04/09/2017   Procedure: COLONOSCOPY;  Surgeon: Saintclair Jasper, MD;  Location: [facility];  Service: Gastroenterology;  Laterality: N/A;   COLONOSCOPY WITH PROPOFOL  N/A 03/08/2016   Procedure: COLONOSCOPY WITH PROPOFOL ;  Surgeon: Gladis MARLA Louder, MD;  Location: [facility];  Service: Endoscopy;  Laterality: N/A;   CYSTO/  BLADDER BX/  HYDRODISTENTION  01-30-2006   ESOPHAGOGASTRODUODENOSCOPY N/A 04/09/2017   Procedure: ESOPHAGOGASTRODUODENOSCOPY (EGD);   Surgeon: Saintclair Jasper, MD;  Location: [facility];  Service: Gastroenterology;  Laterality: N/A;   HEMORROIDECTOMY  1976   LEFT BREAST BX  1980's   BENIGN   RECTAL EXAM UNDER ANESTHESIA N/A 12/10/2014   Procedure: ANAL EXAM UNDER ANESTHESIA  EXCISIONAL BIOPSY OF PERIANAL NEOPLASM;  Surgeon: Bernarda Ned, MD;  Location: [facility];  Service: General;  Laterality: N/A;   TUBAL LIGATION  1980's    Current Outpatient Medications  Medication Sig Dispense Refill   aspirin  EC 81 MG tablet Take 1 tablet (81 mg total) by mouth daily. 90 tablet 3   busPIRone  (BUSPAR ) 5 MG tablet Take 1 tablet (5 mg total) by mouth 2 (two) times daily. 60 tablet 0   cyclobenzaprine  (FLEXERIL ) 10 MG tablet Take 1 tablet (10 mg total) by mouth every 6 to 8 hours. 60 tablet 0   docusate sodium (COLACE) 100 MG capsule Take 100 mg by mouth 2 (two) times daily as needed for mild constipation.     estradiol  (ESTRACE ) 0.1 MG/GM vaginal cream Insert 1 gram vaginally 2 (two) times a week as directed 42.5 g 3   FIBER PO Take 1 capsule by mouth daily as needed (constipation).     fluticasone  (FLONASE ) 50 MCG/ACT nasal spray Place 2 sprays daily into both nostrils. 16 g 6   hydrochlorothiazide  (MICROZIDE ) 12.5 MG capsule Take 1 capsule (  12.5 mg total) by mouth daily. 90 capsule 1   hyoscyamine  (LEVBID ) 0.375 MG 12 hr tablet Take 1 tablet (0.375 mg total) by mouth daily before breakfast as needed. 30 tablet 3   ibuprofen (ADVIL,MOTRIN) 200 MG tablet Take 200-400 mg by mouth daily as needed for headache or moderate pain.     levocetirizine (XYZAL ) 5 MG tablet Take 5 mg by mouth daily as needed for allergies. If Claritin doesn't work     loratadine (CLARITIN) 10 MG tablet Take 10 mg by mouth daily as needed for allergies.     losartan  (COZAAR ) 50 MG tablet Take 1 tablet (50 mg total) by mouth daily. 90 tablet 2   meclizine (ANTIVERT) 25 MG tablet Take 25 mg by mouth as needed (DIZZINESS).      Melatonin 5 MG TABS Take 5  mg by mouth at bedtime as needed (sleep).     meloxicam  (MOBIC ) 7.5 MG tablet Take 1 tablet (7.5 mg total) by mouth daily with a meal. 30 tablet 2   Meth-Hyo-M Bl-Na Phos-Ph Sal (URIBEL ) 118 MG CAPS Take 1 capsule (118 mg total) by mouth 2 (two) times daily as needed. 20 capsule 3   metoprolol  succinate (TOPROL -XL) 25 MG 24 hr tablet Take 1/2 tablets (12.5 mg total) by mouth at bedtime. 45 tablet 4   MINERAL OIL PO Take by mouth as needed (2 TABLESPOONS  AS FOR CONSTIPATION).     mometasone  (ELOCON ) 0.1 % cream Apply a small amount to affected area twice a day 15 g 1   ondansetron  (ZOFRAN ) 4 MG tablet Take 1 tablet by mouth 2 times a day, as needed for 7 days 15 tablet 0   pantoprazole  (PROTONIX ) 40 MG tablet Take 1 tablet (40 mg total) by mouth 2 (two) times a week. 24 tablet 3   rosuvastatin  (CRESTOR ) 10 MG tablet Take 1 tablet (10 mg total) by mouth daily. 90 tablet 2   tiZANidine (ZANAFLEX) 4 MG tablet Take 4 mg by mouth 3 (three) times daily as needed for muscle spasms.     topiramate  (TOPAMAX ) 25 MG tablet Take 1 tablet (25 mg total) by mouth daily. 90 tablet 4   traZODone  (DESYREL ) 50 MG tablet Take 1 tablet (50 mg total) by mouth at bedtime as needed. 30 tablet 2   valACYclovir  (VALTREX ) 1000 MG tablet Take 2 tablets (2,000 mg total) by mouth in morning and 2 tablets (2,000 mg total) in the evening at first sign. 20 tablet 4   venlafaxine  (EFFEXOR ) 100 MG tablet Take 1 tablet (100 mg total) by mouth daily with food 90 tablet 1   Vibegron  (GEMTESA ) 75 MG TABS Take 1 tablet by mouth daily. 30 tablet 6   amoxicillin -clavulanate (AUGMENTIN ) 875-125 MG tablet Take 1 tablet by mouth every 12 (twelve) hours for 7 days. (Patient not taking: Reported on 04/22/2024) 14 tablet 0   aspirin -acetaminophen -caffeine (EXCEDRIN MIGRAINE) 250-250-65 MG tablet Take 1 tablet by mouth daily as needed for headache. (Patient not taking: Reported on 04/22/2024)     benzonatate  (TESSALON ) 200 MG capsule Take 1 capsule  (200 mg total) by mouth 3 (three) times daily as needed. (Patient not taking: Reported on 04/22/2024) 15 capsule 0   ciprofloxacin  (CIPRO ) 500 MG tablet Take 1 tablet (500 mg total) by mouth every 12 (twelve) hours for 7 days (Patient not taking: Reported on 04/22/2024) 14 tablet 0   clotrimazole-betamethasone  (LOTRISONE) cream Apply 1 application topically as needed (rash).  (Patient not taking: Reported on 04/22/2024)  estradiol  (ESTRACE ) 0.1 MG/GM vaginal cream Place 1 gram vaginally 2 (two) times a week as directed. (Patient not taking: Reported on 04/22/2024) 42.5 g 3   hydrochlorothiazide  (MICROZIDE ) 12.5 MG capsule Take 12.5 mg by mouth daily. (Patient not taking: Reported on 04/22/2024)     hyoscyamine  (LEVBID ) 0.375 MG 12 hr tablet Take 1 tablet (0.375 mg total) by mouth daily. (Patient not taking: Reported on 04/22/2024) 30 tablet 4   losartan  (COZAAR ) 50 MG tablet Take 50 mg by mouth daily. (Patient not taking: Reported on 04/22/2024)     meloxicam  (MOBIC ) 15 MG tablet Take 1 tablet (15 mg total) by mouth daily with food. (Patient not taking: Reported on 04/22/2024) 30 tablet 1   nitrofurantoin , macrocrystal-monohydrate, (MACROBID ) 100 MG capsule Take 1 capsule (100 mg total) by mouth every 12 (twelve) hours with food. (Patient not taking: Reported on 04/22/2024) 10 capsule 0   saccharomyces boulardii (FLORASTOR) 250 MG capsule Take 1 capsule by mouth 2 times a day (Patient not taking: Reported on 04/22/2024) 60 capsule 1   sulfamethoxazole -trimethoprim  (BACTRIM  DS) 800-160 MG tablet Take 1 tablet by mouth 2 (two) times daily. (Patient not taking: Reported on 04/22/2024) 14 tablet 0   tretinoin  (RETIN-A ) 0.025 % cream Apply a small amount to affected area every night (Patient not taking: Reported on 04/22/2024) 20 g 2   No current facility-administered medications for this visit.    Allergies  Allergen Reactions   Erythromycin Diarrhea    GI upset    Social History   Socioeconomic History    Marital status: Widowed    Spouse name: married   Number of children: Y   Years of education: Not on file   Highest education level: Not on file  Occupational History   Occupation: admin. assistance    Employer: [employer]  Tobacco Use   Smoking status: Never   Smokeless tobacco: Never  Substance and Sexual Activity   Alcohol use: No   Drug use: No   Sexual activity: Not on file  Other Topics Concern   Not on file  Social History Narrative   Not on file   Social Drivers of Health   Tobacco Use: Low Risk (04/22/2024)   Patient History    Smoking Tobacco Use: Never    Smokeless Tobacco Use: Never    Passive Exposure: Not on file  Financial Resource Strain: Not on file  Food Insecurity: Not on file  Transportation Needs: Not on file  Physical Activity: Not on file  Stress: Not on file  Social Connections: Not on file  Intimate Partner Violence: Not on file  Depression (EYV7-0): Not on file  Alcohol Screen: Not on file  Housing: Unknown (06/18/2023)   Received from [facility]   Epic    Unable to Pay for Housing in the Last Year: Not on file    Number of Times Moved in the Last Year: Not on file    At any time in the past 12 months, were you homeless or living in a shelter (including now)?: No  Utilities: Not on file  Health Literacy: Not on file    Family History  Problem Relation Age of Onset   Rheum arthritis Mother    Breast cancer Mother 11   Heart attack Mother    Stroke Mother    Prostate cancer Father    Emphysema Sister    Stroke Brother    Lung cancer Sister        April Rodriguez  Aneurysm Sister        brain    Review of Systems:  As stated in the HPI and otherwise negative.   BP 116/68   Pulse 90   Ht 5' 3 (1.6 m)   Wt 133 lb (60.3 kg)   SpO2 96%   BMI 23.56 kg/m   Physical Examination: General: Well developed, well nourished, NAD  SKIN: warm, dry. Neuro: No focal deficits  Psychiatric: Mood and affect normal   Neck: No JVD Lungs:Clear bilaterally, no wheezes, rhonci, crackles Cardiovascular: Regular rate and rhythm. No murmurs, gallops or rubs. Abdomen:Soft.  Extremities: No lower extremity edema.  .  EKG:  EKG is not ordered today. The ekg ordered today demonstrates  NSR, incomplete RBBB, LAFB  Recent Labs: No results found for requested labs within last 365 days.   Lipid Panel    Component Value Date/Time   CHOL 132 12/01/2019 0824   TRIG 113 12/01/2019 0824   HDL 52 12/01/2019 0824   CHOLHDL 2.5 12/01/2019 0824   LDLCALC 60 12/01/2019 0824     Wt Readings from Last 3 Encounters:  04/22/24 133 lb (60.3 kg)  04/12/23 127 lb 3.2 oz (57.7 kg)  12/29/21 125 lb 9.6 oz (57 kg)    Assessment and Plan:   1. CAD without angina: No chest pain. She has mild CAD by cath in 2017 and by coronary CTA in 2020.  -Continue ASA, Crestor , Toprol .   2. HTN: BP is controlled.  -Continue Losartan , Toprol  and HCTZ  3. HLD: LDL 65 in November 2025.  -Continue Crestor .   4. RBBB: Chronic  Labs/ tests ordered today include:  Orders Placed This Encounter  Procedures   EKG 12-Lead   Disposition:   F/U with me in 12 months   Signed, Lonni Cash, MD, [facility] 04/22/2024 3:37 PM    [facility] [address] [place], [place], [place]  [address] Phone: [phone]; Fax: [phone]    "

## 2024-04-22 NOTE — Patient Instructions (Signed)
 Medication Instructions:  No Changes  Lab Work: None  Follow-Up: At [facility], you and your health needs are our priority.  As part of our continuing mission to provide you with exceptional heart care, our providers are all part of one team.  This team includes your primary Cardiologist (physician) and Advanced Practice Providers or APPs (Physician Assistants and Nurse Practitioners) who all work together to provide you with the care you need, when you need it.  Your next appointment:   1 year(s)  Provider:   Lonni Cash, MD

## 2024-04-28 ENCOUNTER — Other Ambulatory Visit: Payer: Self-pay | Admitting: Cardiovascular Disease

## 2024-05-01 ENCOUNTER — Other Ambulatory Visit (HOSPITAL_COMMUNITY): Payer: Self-pay

## 2024-05-01 MED ORDER — ROSUVASTATIN CALCIUM 10 MG PO TABS
10.0000 mg | ORAL_TABLET | Freq: Every day | ORAL | 2 refills | Status: AC
  Filled 2024-05-01: qty 90, 90d supply, fill #0

## 2024-05-04 ENCOUNTER — Other Ambulatory Visit (HOSPITAL_COMMUNITY): Payer: Self-pay

## 2024-05-04 MED ORDER — BENZONATATE 200 MG PO CAPS
200.0000 mg | ORAL_CAPSULE | Freq: Three times a day (TID) | ORAL | 0 refills | Status: AC | PRN
  Filled 2024-05-04: qty 15, 5d supply, fill #0

## 2024-05-05 ENCOUNTER — Other Ambulatory Visit (HOSPITAL_COMMUNITY): Payer: Self-pay

## 2024-05-05 MED ORDER — HYDROCHLOROTHIAZIDE 12.5 MG PO CAPS: 12.5000 mg | ORAL_CAPSULE | Freq: Every day | ORAL | 1 refills | Status: AC

## 2024-05-07 ENCOUNTER — Other Ambulatory Visit (HOSPITAL_COMMUNITY): Payer: Self-pay

## 2024-05-08 ENCOUNTER — Other Ambulatory Visit: Payer: Self-pay
